# Patient Record
Sex: Female | Born: 1965 | Race: White | Hispanic: No | Smoking: Former smoker
Health system: Southern US, Community
[De-identification: ages and names within clinical notes are randomized; demographics above are authoritative.]

## PROBLEM LIST (undated history)

## (undated) DIAGNOSIS — G629 Polyneuropathy, unspecified: Secondary | ICD-10-CM

## (undated) DIAGNOSIS — I10 Essential (primary) hypertension: Secondary | ICD-10-CM

## (undated) DIAGNOSIS — M898X9 Other specified disorders of bone, unspecified site: Secondary | ICD-10-CM

## (undated) DIAGNOSIS — J449 Chronic obstructive pulmonary disease, unspecified: Secondary | ICD-10-CM

## (undated) DIAGNOSIS — M1990 Unspecified osteoarthritis, unspecified site: Secondary | ICD-10-CM

## (undated) DIAGNOSIS — Z9981 Dependence on supplemental oxygen: Secondary | ICD-10-CM

## (undated) DIAGNOSIS — E119 Type 2 diabetes mellitus without complications: Secondary | ICD-10-CM

## (undated) DIAGNOSIS — M341 CR(E)ST syndrome: Secondary | ICD-10-CM

## (undated) DIAGNOSIS — C801 Malignant (primary) neoplasm, unspecified: Secondary | ICD-10-CM

## (undated) HISTORY — PX: ABDOMINAL HYSTERECTOMY: SHX81

## (undated) HISTORY — PX: KNEE ARTHROSCOPY WITH PATELLA RECONSTRUCTION: SHX5654

## (undated) HISTORY — DX: Chronic obstructive pulmonary disease, unspecified: J44.9

## (undated) HISTORY — DX: Type 2 diabetes mellitus without complications: E11.9

## (undated) HISTORY — DX: Unspecified osteoarthritis, unspecified site: M19.90

## (undated) HISTORY — DX: Malignant (primary) neoplasm, unspecified: C80.1

## (undated) HISTORY — DX: Essential (primary) hypertension: I10

## (undated) HISTORY — PX: TONSILLECTOMY: SUR1361

---

## 2014-07-03 HISTORY — PX: PARTIAL HYSTERECTOMY: SHX80

## 2015-03-09 ENCOUNTER — Encounter: Payer: Self-pay | Admitting: *Deleted

## 2015-03-10 ENCOUNTER — Encounter: Payer: Self-pay | Admitting: Obstetrics & Gynecology

## 2015-03-21 ENCOUNTER — Encounter: Payer: Self-pay | Admitting: Obstetrics & Gynecology

## 2015-03-23 ENCOUNTER — Encounter: Payer: Self-pay | Admitting: Obstetrics & Gynecology

## 2015-03-24 ENCOUNTER — Encounter: Payer: Self-pay | Admitting: Obstetrics & Gynecology

## 2015-03-27 ENCOUNTER — Encounter: Payer: Self-pay | Admitting: Obstetrics & Gynecology

## 2015-04-11 ENCOUNTER — Ambulatory Visit (INDEPENDENT_AMBULATORY_CARE_PROVIDER_SITE_OTHER): Payer: Medicaid Other | Admitting: Obstetrics & Gynecology

## 2015-04-11 ENCOUNTER — Encounter: Payer: Self-pay | Admitting: Obstetrics & Gynecology

## 2015-04-11 VITALS — BP 120/70 | HR 72 | Ht 66.2 in | Wt 232.0 lb

## 2015-04-11 DIAGNOSIS — A499 Bacterial infection, unspecified: Secondary | ICD-10-CM

## 2015-04-11 DIAGNOSIS — N816 Rectocele: Secondary | ICD-10-CM

## 2015-04-11 DIAGNOSIS — B9689 Other specified bacterial agents as the cause of diseases classified elsewhere: Secondary | ICD-10-CM

## 2015-04-11 DIAGNOSIS — N760 Acute vaginitis: Secondary | ICD-10-CM

## 2015-04-11 MED ORDER — METRONIDAZOLE 500 MG PO TABS: 500.0000 mg | ORAL_TABLET | Freq: Two times a day (BID) | ORAL | Status: DC

## 2015-04-11 NOTE — Progress Notes (Signed)
Patient ID: Jennifer Odonnell, female   DOB: 03/10/1966, 49 y.o.   MRN: [id number] Chief Complaint  Patient presents with  . Referral    c/c feel like inside falling out, when use bathroom/ has vaginal odor.    Blood pressure 120/70, pulse 72, height 5' 6.2" (1.681 m), weight 232 lb (105.235 kg).  Subjective Feels like something is "coming" out since a couple of months after her hysterectomy Also with bad odor not much dicharge  Objective Vulva:  normal appearing vulva with no masses, tenderness or lesions Vagina:  normal mucosa, no discharge Cervix:  absent Uterus:  uterus absent Adnexa: ovaries:present,  normal adnexa in size, nontender and no masses   Pertinent ROS No burning with urination, frequency or urgency No nausea, vomiting or diarrhea Nor fever chills or other constitutional symptoms   Labs or studies   Impression New Diagnosis: Rectocoele, Grade III BV(by symptoms, no yeast on exam)  Established relevant diagnosis(es): DM COPD HTN  Plan/Recommendations Metronidazole 500 BID x 7 days No straining  Follow up 3 months or prn   All questions were answered.

## 2015-05-02 ENCOUNTER — Ambulatory Visit (INDEPENDENT_AMBULATORY_CARE_PROVIDER_SITE_OTHER): Payer: Medicaid Other | Admitting: Obstetrics & Gynecology

## 2015-05-02 ENCOUNTER — Encounter: Payer: Self-pay | Admitting: Obstetrics & Gynecology

## 2015-05-02 VITALS — BP 110/70 | HR 76 | Wt 233.0 lb

## 2015-05-02 DIAGNOSIS — N993 Prolapse of vaginal vault after hysterectomy: Secondary | ICD-10-CM | POA: Diagnosis not present

## 2015-05-02 DIAGNOSIS — E860 Dehydration: Secondary | ICD-10-CM | POA: Diagnosis not present

## 2015-05-02 DIAGNOSIS — IMO0002 Reserved for concepts with insufficient information to code with codable children: Secondary | ICD-10-CM

## 2015-05-02 DIAGNOSIS — N816 Rectocele: Secondary | ICD-10-CM | POA: Diagnosis not present

## 2015-05-02 DIAGNOSIS — N8110 Cystocele, unspecified: Secondary | ICD-10-CM | POA: Diagnosis not present

## 2015-05-02 DIAGNOSIS — R8290 Unspecified abnormal findings in urine: Secondary | ICD-10-CM | POA: Diagnosis not present

## 2015-05-02 LAB — POCT URINALYSIS DIPSTICK
Blood, UA: NEGATIVE
Glucose, UA: NEGATIVE
Ketones, UA: NEGATIVE
LEUKOCYTES UA: NEGATIVE
NITRITE UA: NEGATIVE
PROTEIN UA: NEGATIVE

## 2015-05-02 NOTE — Progress Notes (Signed)
Patient ID: Jennifer Odonnell, female   DOB: [birth year], 49 y.o.   MRN: [id number] Chief Complaint  Patient presents with  . gyn visit    something protruding out/painful.    Blood pressure 110/70, pulse 76, weight 233 lb (105.688 kg).  49 y.o. G1P0 No LMP recorded. Patient has had a hysterectomy. The current method of family planning is status post hysterectomy.  Subjective Pt comes in complaining of a strong vaginal odor She was seen 3 weeks ago and had BV but took her metronidazole and on exam today that is cleared  Upon questioning it is only when she urinates and is not constant. UA is negative but does have a strong odor and a deep color consistent with dehydration, pt drinks caffeine beverages all day long she states, not water  2nd issue, on going pelvic pressure and pain from pelvic organ prolapse It is noted from previous exam she has anterior, mid, and posterior compartment prolapse s/p hysterectomy some years ago The posterior compartment is the worst, Grade III   Objective Vulva:  normal appearing vulva with no masses, tenderness or lesions Vagina:  Grade II anterior POP, Grade III posterior POP, Grade II vaginal apex prolapse Cervix:  absent Uterus:  uterus absent Adnexa: ovaries:present,  normal adnexa in size, nontender and no masses    Pertinent ROS No burning with urination, frequency or urgency No nausea, vomiting or diarrhea Nor fever chills or other constitutional symptoms   Labs or studies UA today is negative    Impression Diagnoses this Encounter::   ICD-9-CM ICD-10-CM   1. Dehydration 276.51 E86.0   2. Bad odor of urine 791.9 R82.90 POCT urinalysis dipstick  3. Rectocele 618.04 N81.6   4. Cystocele 618.01 N81.10   5. 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 POCT  urinalysis dipstick    I have instructed patient to drink water until her urine clears and see if she still has the strong, I think ammonia, odor  Additionally I am referring her to the [facility] pelvic reconstruction clinic for evaluation, she may need laparoscopic sacrocolpopexy and address anterior/posterior POP additionally That internal EPIC referral is made today  Follow up Return if symptoms worsen or fail to improve, for Follow up with [facility] pelvic reconstruction group in [place].   All questions were answered

## 2015-05-03 ENCOUNTER — Telehealth: Payer: Self-pay | Admitting: *Deleted

## 2015-05-03 NOTE — Telephone Encounter (Signed)
Pt informed referral for the [facility]construction [facility] completed. Pt will be contacted by their office for appt date and time. Pt verbalized understanding.

## 2015-05-30 DIAGNOSIS — N3946 Mixed incontinence: Secondary | ICD-10-CM | POA: Insufficient documentation

## 2015-07-12 ENCOUNTER — Ambulatory Visit: Payer: Self-pay | Admitting: Obstetrics & Gynecology

## 2015-07-13 ENCOUNTER — Ambulatory Visit: Payer: Self-pay | Admitting: Obstetrics & Gynecology

## 2018-09-02 HISTORY — PX: BLADDER EXTROPHY RECONSTRUCTION PELVIC SAGITTAL OSTEOTOMY: SHX1236

## 2020-09-07 ENCOUNTER — Encounter (HOSPITAL_COMMUNITY): Payer: Self-pay | Admitting: Emergency Medicine

## 2020-09-07 ENCOUNTER — Other Ambulatory Visit: Payer: Self-pay

## 2020-09-07 ENCOUNTER — Inpatient Hospital Stay (HOSPITAL_COMMUNITY)
Admission: EM | Admit: 2020-09-07 | Discharge: 2020-09-11 | DRG: 193 | Disposition: A | Discharge status: Home / Self Care | Payer: Medicaid - Out of State | Attending: Family Medicine | Admitting: Family Medicine

## 2020-09-07 ENCOUNTER — Emergency Department (HOSPITAL_COMMUNITY): Payer: Medicaid - Out of State

## 2020-09-07 DIAGNOSIS — J441 Chronic obstructive pulmonary disease with (acute) exacerbation: Secondary | ICD-10-CM | POA: Diagnosis present

## 2020-09-07 DIAGNOSIS — E119 Type 2 diabetes mellitus without complications: Secondary | ICD-10-CM

## 2020-09-07 DIAGNOSIS — J9601 Acute respiratory failure with hypoxia: Secondary | ICD-10-CM | POA: Diagnosis present

## 2020-09-07 DIAGNOSIS — Z90710 Acquired absence of both cervix and uterus: Secondary | ICD-10-CM | POA: Diagnosis not present

## 2020-09-07 DIAGNOSIS — Z8541 Personal history of malignant neoplasm of cervix uteri: Secondary | ICD-10-CM

## 2020-09-07 DIAGNOSIS — J189 Pneumonia, unspecified organism: Secondary | ICD-10-CM | POA: Diagnosis present

## 2020-09-07 DIAGNOSIS — M341 CR(E)ST syndrome: Secondary | ICD-10-CM | POA: Diagnosis present

## 2020-09-07 DIAGNOSIS — Z794 Long term (current) use of insulin: Secondary | ICD-10-CM

## 2020-09-07 DIAGNOSIS — Z79899 Other long term (current) drug therapy: Secondary | ICD-10-CM

## 2020-09-07 DIAGNOSIS — J188 Other pneumonia, unspecified organism: Secondary | ICD-10-CM | POA: Diagnosis present

## 2020-09-07 DIAGNOSIS — Z7952 Long term (current) use of systemic steroids: Secondary | ICD-10-CM

## 2020-09-07 DIAGNOSIS — Z881 Allergy status to other antibiotic agents status: Secondary | ICD-10-CM | POA: Diagnosis not present

## 2020-09-07 DIAGNOSIS — J440 Chronic obstructive pulmonary disease with acute lower respiratory infection: Secondary | ICD-10-CM | POA: Diagnosis present

## 2020-09-07 DIAGNOSIS — Z6841 Body Mass Index (BMI) 40.0 and over, adult: Secondary | ICD-10-CM

## 2020-09-07 DIAGNOSIS — Z7951 Long term (current) use of inhaled steroids: Secondary | ICD-10-CM

## 2020-09-07 DIAGNOSIS — E1165 Type 2 diabetes mellitus with hyperglycemia: Secondary | ICD-10-CM | POA: Diagnosis present

## 2020-09-07 DIAGNOSIS — D849 Immunodeficiency, unspecified: Secondary | ICD-10-CM | POA: Diagnosis present

## 2020-09-07 DIAGNOSIS — Z87891 Personal history of nicotine dependence: Secondary | ICD-10-CM | POA: Diagnosis not present

## 2020-09-07 DIAGNOSIS — Z825 Family history of asthma and other chronic lower respiratory diseases: Secondary | ICD-10-CM | POA: Diagnosis not present

## 2020-09-07 DIAGNOSIS — Z20822 Contact with and (suspected) exposure to covid-19: Secondary | ICD-10-CM | POA: Diagnosis present

## 2020-09-07 DIAGNOSIS — I10 Essential (primary) hypertension: Secondary | ICD-10-CM | POA: Diagnosis present

## 2020-09-07 DIAGNOSIS — B9781 Human metapneumovirus as the cause of diseases classified elsewhere: Secondary | ICD-10-CM | POA: Diagnosis present

## 2020-09-07 DIAGNOSIS — Z7982 Long term (current) use of aspirin: Secondary | ICD-10-CM

## 2020-09-07 DIAGNOSIS — Z888 Allergy status to other drugs, medicaments and biological substances status: Secondary | ICD-10-CM

## 2020-09-07 LAB — BASIC METABOLIC PANEL
Anion gap: 11 (ref 5–15)
BUN: 15 mg/dL (ref 6–20)
CO2: 32 mmol/L (ref 22–32)
Calcium: 8.9 mg/dL (ref 8.9–10.3)
Chloride: 93 mmol/L — ABNORMAL LOW (ref 98–111)
Creatinine, Ser: 0.91 mg/dL (ref 0.44–1.00)
GFR, Estimated: >60 mL/min (ref >60)
Glucose, Bld: 146 mg/dL — ABNORMAL HIGH (ref 70–99)
Potassium: 4 mmol/L (ref 3.5–5.1)
Sodium: 136 mmol/L (ref 135–145)

## 2020-09-07 LAB — CBC WITH DIFFERENTIAL/PLATELET
Abs Immature Granulocytes: 0.05 10*3/uL (ref 0.00–0.07)
Basophils Absolute: 0 10*3/uL (ref 0.0–0.1)
Basophils Relative: 0 %
Eosinophils Absolute: 0.1 10*3/uL (ref 0.0–0.5)
Eosinophils Relative: 1 %
HCT: 41.7 % (ref 36.0–46.0)
Hemoglobin: 13 g/dL (ref 12.0–15.0)
Immature Granulocytes: 1 %
Lymphocytes Relative: 10 %
Lymphs Abs: 0.9 10*3/uL (ref 0.7–4.0)
MCH: 32 pg (ref 26.0–34.0)
MCHC: 31.2 g/dL (ref 30.0–36.0)
MCV: 102.7 fL — ABNORMAL HIGH (ref 80.0–100.0)
Monocytes Absolute: 0.4 10*3/uL (ref 0.1–1.0)
Monocytes Relative: 4 %
Neutro Abs: 7.6 10*3/uL (ref 1.7–7.7)
Neutrophils Relative %: 84 %
Platelets: 183 10*3/uL (ref 150–400)
RBC: 4.06 MIL/uL (ref 3.87–5.11)
RDW: 13.6 % (ref 11.5–15.5)
WBC: 8.9 10*3/uL (ref 4.0–10.5)
nRBC: 0.4 % — ABNORMAL HIGH (ref 0.0–0.2)

## 2020-09-07 LAB — RESP PANEL BY RT-PCR (FLU A&B, COVID) ARPGX2
Influenza A by PCR: NEGATIVE
Influenza B by PCR: NEGATIVE
SARS Coronavirus 2 by RT PCR: NEGATIVE

## 2020-09-07 LAB — CBG MONITORING, ED: Glucose-Capillary: 178 mg/dL — ABNORMAL HIGH (ref 70–99)

## 2020-09-07 LAB — PROCALCITONIN: Procalcitonin: <0.1 ng/mL

## 2020-09-07 LAB — POC SARS CORONAVIRUS 2 AG -  ED: SARS Coronavirus 2 Ag: NEGATIVE

## 2020-09-07 MED ORDER — SIMVASTATIN 20 MG PO TABS
40.0000 mg | ORAL_TABLET | Freq: Every evening | ORAL | Status: DC
Start: 2020-09-08 — End: 2020-09-11
  Administered 2020-09-08 – 2020-09-10 (×3): 40 mg via ORAL
  Filled 2020-09-07 (×3): qty 2

## 2020-09-07 MED ORDER — MAGNESIUM SULFATE 2 GM/50ML IV SOLN
2.0000 g | Freq: Once | INTRAVENOUS | Status: AC
  Administered 2020-09-08: 2 g via INTRAVENOUS
  Filled 2020-09-07: qty 50

## 2020-09-07 MED ORDER — SODIUM CHLORIDE 0.9 % IV SOLN
500.0000 mg | INTRAVENOUS | Status: DC
  Administered 2020-09-08 – 2020-09-10 (×4): 500 mg via INTRAVENOUS
  Filled 2020-09-07 (×4): qty 500

## 2020-09-07 MED ORDER — ADULT MULTIVITAMIN W/MINERALS CH
1.0000 | ORAL_TABLET | Freq: Every day | ORAL | Status: DC
  Administered 2020-09-08 – 2020-09-11 (×4): 1 via ORAL
  Filled 2020-09-07 (×4): qty 1

## 2020-09-07 MED ORDER — NYSTATIN 100000 UNIT/GM EX OINT
1.0000 "application " | TOPICAL_OINTMENT | Freq: Two times a day (BID) | CUTANEOUS | Status: DC
  Administered 2020-09-08 – 2020-09-10 (×3): 1 via TOPICAL
  Filled 2020-09-07 (×2): qty 15

## 2020-09-07 MED ORDER — SODIUM CHLORIDE 0.9 % IV SOLN: 2.0000 g | INTRAVENOUS | Status: DC

## 2020-09-07 MED ORDER — DICLOFENAC SODIUM 1 % EX GEL
2.0000 g | Freq: Four times a day (QID) | CUTANEOUS | Status: DC
  Filled 2020-09-07 (×2): qty 100

## 2020-09-07 MED ORDER — SODIUM CHLORIDE 0.9 % IV SOLN: 500.0000 mg | Freq: Once | INTRAVENOUS | Status: DC

## 2020-09-07 MED ORDER — UMECLIDINIUM BROMIDE 62.5 MCG/INH IN AEPB: 1.0000 | INHALATION_SPRAY | Freq: Every day | RESPIRATORY_TRACT | Status: DC

## 2020-09-07 MED ORDER — ALBUTEROL SULFATE HFA 108 (90 BASE) MCG/ACT IN AERS
6.0000 | INHALATION_SPRAY | Freq: Once | RESPIRATORY_TRACT | Status: AC
  Administered 2020-09-07: 6 via RESPIRATORY_TRACT
  Filled 2020-09-07: qty 6.7

## 2020-09-07 MED ORDER — GABAPENTIN 300 MG PO CAPS
600.0000 mg | ORAL_CAPSULE | Freq: Three times a day (TID) | ORAL | Status: DC
  Administered 2020-09-08 – 2020-09-11 (×12): 600 mg via ORAL
  Filled 2020-09-07 (×12): qty 2

## 2020-09-07 MED ORDER — LOSARTAN POTASSIUM-HCTZ 100-12.5 MG PO TABS: 1.0000 | ORAL_TABLET | Freq: Every day | ORAL | Status: DC

## 2020-09-07 MED ORDER — ONE-A-DAY WOMENS FORMULA PO TABS: 1.0000 | ORAL_TABLET | Freq: Every day | ORAL | Status: DC

## 2020-09-07 MED ORDER — IPRATROPIUM-ALBUTEROL 0.5-2.5 (3) MG/3ML IN SOLN
3.0000 mL | Freq: Three times a day (TID) | RESPIRATORY_TRACT | Status: DC
  Administered 2020-09-08: 3 mL via RESPIRATORY_TRACT
  Filled 2020-09-07: qty 3

## 2020-09-07 MED ORDER — LOSARTAN POTASSIUM 50 MG PO TABS
100.0000 mg | ORAL_TABLET | Freq: Every day | ORAL | Status: DC
  Administered 2020-09-08 – 2020-09-11 (×4): 100 mg via ORAL
  Filled 2020-09-07 (×5): qty 2

## 2020-09-07 MED ORDER — PREDNISONE 20 MG PO TABS: 40.0000 mg | ORAL_TABLET | Freq: Every day | ORAL | Status: DC

## 2020-09-07 MED ORDER — AEROCHAMBER PLUS FLO-VU MEDIUM MISC
1.0000 | Freq: Once | Status: AC
  Administered 2020-09-07: 1
  Filled 2020-09-07: qty 1

## 2020-09-07 MED ORDER — PANTOPRAZOLE SODIUM 40 MG PO TBEC
80.0000 mg | DELAYED_RELEASE_TABLET | Freq: Every day | ORAL | Status: DC
  Administered 2020-09-08 – 2020-09-11 (×4): 80 mg via ORAL
  Filled 2020-09-07 (×4): qty 2

## 2020-09-07 MED ORDER — CARISOPRODOL 350 MG PO TABS
350.0000 mg | ORAL_TABLET | Freq: Three times a day (TID) | ORAL | Status: DC
  Administered 2020-09-08 – 2020-09-11 (×12): 350 mg via ORAL
  Filled 2020-09-07 (×12): qty 1

## 2020-09-07 MED ORDER — ATENOLOL 25 MG PO TABS
25.0000 mg | ORAL_TABLET | Freq: Every day | ORAL | Status: DC
  Administered 2020-09-08 – 2020-09-11 (×4): 25 mg via ORAL
  Filled 2020-09-07 (×4): qty 1

## 2020-09-07 MED ORDER — MOMETASONE FURO-FORMOTEROL FUM 200-5 MCG/ACT IN AERO
2.0000 | INHALATION_SPRAY | Freq: Two times a day (BID) | RESPIRATORY_TRACT | Status: DC
  Administered 2020-09-08 – 2020-09-11 (×6): 2 via RESPIRATORY_TRACT
  Filled 2020-09-07 (×2): qty 8.8

## 2020-09-07 MED ORDER — INSULIN GLARGINE 100 UNIT/ML ~~LOC~~ SOLN
35.0000 [IU] | Freq: Two times a day (BID) | SUBCUTANEOUS | Status: DC
  Administered 2020-09-08 – 2020-09-11 (×7): 35 [IU] via SUBCUTANEOUS
  Filled 2020-09-07 (×11): qty 0.35

## 2020-09-07 MED ORDER — INSULIN ASPART 100 UNIT/ML ~~LOC~~ SOLN
0.0000 [IU] | Freq: Three times a day (TID) | SUBCUTANEOUS | Status: DC
  Administered 2020-09-08: 20 [IU] via SUBCUTANEOUS
  Administered 2020-09-08: 7 [IU] via SUBCUTANEOUS
  Administered 2020-09-09: 15 [IU] via SUBCUTANEOUS
  Administered 2020-09-09 – 2020-09-10 (×3): 11 [IU] via SUBCUTANEOUS
  Administered 2020-09-10: 7 [IU] via SUBCUTANEOUS
  Administered 2020-09-10: 11 [IU] via SUBCUTANEOUS
  Administered 2020-09-11: 3 [IU] via SUBCUTANEOUS
  Administered 2020-09-11: 7 [IU] via SUBCUTANEOUS

## 2020-09-07 MED ORDER — FOLIC ACID 1 MG PO TABS
1.0000 mg | ORAL_TABLET | Freq: Every day | ORAL | Status: DC
  Administered 2020-09-08 – 2020-09-11 (×4): 1 mg via ORAL
  Filled 2020-09-07 (×4): qty 1

## 2020-09-07 MED ORDER — INDOMETHACIN 25 MG PO CAPS
50.0000 mg | ORAL_CAPSULE | Freq: Two times a day (BID) | ORAL | Status: DC
  Administered 2020-09-08: 50 mg via ORAL
  Filled 2020-09-07: qty 2

## 2020-09-07 MED ORDER — ENOXAPARIN SODIUM 40 MG/0.4ML ~~LOC~~ SOLN
40.0000 mg | SUBCUTANEOUS | Status: DC
  Administered 2020-09-08: 40 mg via SUBCUTANEOUS
  Filled 2020-09-07: qty 0.4

## 2020-09-07 MED ORDER — SODIUM CHLORIDE 0.9 % IV SOLN
2.0000 g | INTRAVENOUS | Status: DC
  Administered 2020-09-08 – 2020-09-10 (×4): 2 g via INTRAVENOUS
  Filled 2020-09-07 (×4): qty 20

## 2020-09-07 MED ORDER — ALBUTEROL SULFATE (2.5 MG/3ML) 0.083% IN NEBU
2.5000 mg | INHALATION_SOLUTION | RESPIRATORY_TRACT | Status: DC | PRN
  Administered 2020-09-08 – 2020-09-11 (×4): 2.5 mg via RESPIRATORY_TRACT
  Filled 2020-09-07 (×4): qty 3

## 2020-09-07 MED ORDER — HYDROCHLOROTHIAZIDE 12.5 MG PO CAPS
12.5000 mg | ORAL_CAPSULE | Freq: Every day | ORAL | Status: DC
  Administered 2020-09-08 – 2020-09-11 (×4): 12.5 mg via ORAL
  Filled 2020-09-07 (×4): qty 1

## 2020-09-07 MED ORDER — ENOXAPARIN SODIUM 40 MG/0.4ML ~~LOC~~ SOLN: 40.0000 mg | SUBCUTANEOUS | Status: DC

## 2020-09-07 MED ORDER — ALBUTEROL (5 MG/ML) CONTINUOUS INHALATION SOLN
10.0000 mg/h | INHALATION_SOLUTION | Freq: Once | RESPIRATORY_TRACT | Status: AC
  Administered 2020-09-07: 10 mg/h via RESPIRATORY_TRACT
  Filled 2020-09-07: qty 20

## 2020-09-07 MED ORDER — OXYCODONE HCL 5 MG PO TABS
10.0000 mg | ORAL_TABLET | Freq: Four times a day (QID) | ORAL | Status: DC | PRN
  Administered 2020-09-08 – 2020-09-11 (×11): 10 mg via ORAL
  Filled 2020-09-07 (×11): qty 2

## 2020-09-07 MED ORDER — IBUPROFEN 800 MG PO TABS
800.0000 mg | ORAL_TABLET | Freq: Two times a day (BID) | ORAL | Status: DC
  Administered 2020-09-08: 800 mg via ORAL
  Filled 2020-09-07: qty 1

## 2020-09-07 MED ORDER — HYDROXYCHLOROQUINE SULFATE 200 MG PO TABS: 400.0000 mg | ORAL_TABLET | Freq: Every day | ORAL | Status: DC

## 2020-09-07 MED ORDER — METHYLPREDNISOLONE SODIUM SUCC 125 MG IJ SOLR
125.0000 mg | Freq: Once | INTRAMUSCULAR | Status: AC
  Administered 2020-09-07: 125 mg via INTRAVENOUS
  Filled 2020-09-07: qty 2

## 2020-09-07 MED ORDER — FENOFIBRATE 54 MG PO TABS
54.0000 mg | ORAL_TABLET | Freq: Every day | ORAL | Status: DC
  Administered 2020-09-08 – 2020-09-11 (×4): 54 mg via ORAL
  Filled 2020-09-07 (×6): qty 1

## 2020-09-07 MED ORDER — ASPIRIN EC 81 MG PO TBEC
81.0000 mg | DELAYED_RELEASE_TABLET | Freq: Every day | ORAL | Status: DC
  Administered 2020-09-08 – 2020-09-11 (×4): 81 mg via ORAL
  Filled 2020-09-07 (×4): qty 1

## 2020-09-07 MED ORDER — DOCUSATE SODIUM 100 MG PO CAPS
100.0000 mg | ORAL_CAPSULE | Freq: Two times a day (BID) | ORAL | Status: DC
  Administered 2020-09-08 – 2020-09-11 (×8): 100 mg via ORAL
  Filled 2020-09-07 (×9): qty 1

## 2020-09-07 MED ORDER — FERROUS SULFATE 325 (65 FE) MG PO TABS
325.0000 mg | ORAL_TABLET | Freq: Every day | ORAL | Status: DC
Start: 2020-09-08 — End: 2020-09-11
  Administered 2020-09-08 – 2020-09-10 (×3): 325 mg via ORAL
  Filled 2020-09-07 (×5): qty 1

## 2020-09-07 MED ORDER — SODIUM CHLORIDE 0.9 % IV SOLN: 1.0000 g | Freq: Once | INTRAVENOUS | Status: DC

## 2020-09-07 MED ORDER — PROMETHAZINE HCL 12.5 MG PO TABS
25.0000 mg | ORAL_TABLET | Freq: Two times a day (BID) | ORAL | Status: DC | PRN
  Administered 2020-09-08: 25 mg via ORAL
  Filled 2020-09-07: qty 2

## 2020-09-07 MED ORDER — COLCHICINE 0.6 MG PO TABS
0.6000 mg | ORAL_TABLET | Freq: Every day | ORAL | Status: DC
  Administered 2020-09-08 – 2020-09-11 (×4): 0.6 mg via ORAL
  Filled 2020-09-07 (×4): qty 1

## 2020-09-07 MED ORDER — ESTROGENS CONJUGATED 0.625 MG PO TABS
0.6250 mg | ORAL_TABLET | Freq: Every day | ORAL | Status: DC
  Administered 2020-09-08 – 2020-09-11 (×4): 0.625 mg via ORAL
  Filled 2020-09-07 (×10): qty 1

## 2020-09-07 NOTE — ED Notes (Addendum)
Waiting to start meds once pt has IV access. Difficult stick. Ultrasound needed

## 2020-09-07 NOTE — ED Triage Notes (Signed)
Pt to the ED with hypoxia and covid symptoms since Monday.  Symptoms include coughing, congestion,and HA.   Pt's oxygen saturation was at 67 on RA. Pt's nailbeds and lips were cyanotic.  Pt was placed on 2L [place] and sats rose to 88. Pt on 3L [place] and sats are 92. Pt has a history of COPD.

## 2020-09-07 NOTE — ED Provider Notes (Signed)
[facility] EMERGENCY DEPARTMENT Provider Note   CSN: [id number] Arrival date & time: 09/07/20  1726     History Chief Complaint  Patient presents with  . Hypoxia    Jennifer Odonnell is a 55 y.o. female.  HPI   55 y/o female with a h/o arthritis, CA, COPD, DM, HTN, RA, who presents to the ED today for eval of shortness of breath.  4 days ago she started having nasal congestion, chest congestion, cough and headache.  Also with subjective fevers.  No chest pain.  No vomiting or diarrhea.  Has not had her Covid vaccine and denies any known exposures.  States she does not use oxygen at home.  Past Medical History:  Diagnosis Date  . Arthritis   . Cancer ([place])    cervical cancer at age 71  . COPD (chronic obstructive pulmonary disease) ([place])   . Diabetes mellitus without complication ([place])   . Hypertension     Patient Active Problem List   Diagnosis Date Noted  . COPD with acute exacerbation ([place]) 09/07/2020  . Multifocal pneumonia 09/07/2020  . Acute respiratory failure with hypoxia ([place]) 09/07/2020  . Mixed incontinence urge and stress 05/30/2015    Past Surgical History:  Procedure Laterality Date  . ABDOMINAL HYSTERECTOMY    . BLADDER EXTROPHY RECONSTRUCTION PELVIC SAGITTAL OSTEOTOMY  2020  . KNEE ARTHROSCOPY WITH PATELLA RECONSTRUCTION Bilateral   . PARTIAL HYSTERECTOMY  07/2014  . TONSILLECTOMY       OB History    Gravida  1   Para      Term      Preterm      AB      Living        SAB      IAB      Ectopic      Multiple      Live Births              Family History  Problem Relation Age of Onset  . Asthma Mother   . Emphysema Mother   . Arthritis Mother   . Hyperlipidemia Mother   . Diabetes Mother   . Hypertension Mother   . Heart disease Father   . Heart attack Father   . Cancer Father        throat  . Hyperlipidemia Father   . Alcohol abuse Father   . Arthritis Father   . Asthma Sister   . Emphysema Sister   . Seizures  Sister   . Hypertension Sister   . Arthritis Brother   . Hypertension Brother   . Thyroid disease Daughter     Social History   Tobacco Use  . Smoking status: Former Smoker    Packs/day: 1.00    Years: 30.00    Pack years: 30.00    Types: Cigarettes  . Smokeless tobacco: Never Used  . Tobacco comment: quit smoking 7 years ago  Vaping Use  . Vaping Use: Every day  . Substances: Nicotine, Flavoring  Substance Use Topics  . Alcohol use: Not Currently  . Drug use: Not Currently    Home Medications Prior to Admission medications   Medication Sig Start Date End Date Taking? Authorizing Provider  albuterol (PROVENTIL HFA;VENTOLIN HFA) 108 (90 BASE) MCG/ACT inhaler Inhale 2 puffs into the lungs every 6 (six) hours as needed for wheezing or shortness of breath.   Yes [provider]  albuterol (PROVENTIL) (2.5 MG/3ML) 0.083% nebulizer solution Take 2.5 mg by nebulization every 6 (  six) hours as needed for wheezing or shortness of breath.   Yes [provider]  ascorbic acid (VITAMIN C) 500 MG tablet Take 500 mg by mouth daily.   Yes [provider]  aspirin 81 MG EC tablet Take 81 mg by mouth daily.   Yes [provider]  atenolol (TENORMIN) 25 MG tablet Take 25 mg by mouth daily. 08/14/20  Yes [provider]  budesonide (PULMICORT) 0.5 MG/2ML nebulizer solution Take 0.5 mg by nebulization 2 (two) times daily. 08/14/20  Yes [provider]  carisoprodol (SOMA) 350 MG tablet Take 350 mg by mouth 3 (three) times daily. 08/04/20  Yes [provider]  colchicine 0.6 MG tablet Take 0.6 mg by mouth daily. 09/04/20  Yes [provider]  diclofenac Sodium (VOLTAREN) 1 % GEL Apply 1 g topically 4 (four) times daily. 08/14/20  Yes [provider]  docusate sodium (COLACE) 100 MG capsule Take 100 mg by mouth 2 (two) times daily.   Yes [provider]  fenofibrate (TRICOR) 48 MG tablet Take 48 mg by mouth daily.  08/14/20  Yes [provider]  FEROSUL 325 (65 Fe) MG tablet Take 325 mg by mouth daily. 08/15/20  Yes [provider]  folic acid (FOLVITE) 1 MG tablet Take 1 mg by mouth daily. 08/14/20  Yes [provider]  furosemide (LASIX) 20 MG tablet Take 20 mg by mouth daily. 08/14/20  Yes [provider]  gabapentin (NEURONTIN) 600 MG tablet Take 600 mg by mouth 3 (three) times daily. 09/01/20  Yes [provider]  glipiZIDE (GLUCOTROL XL) 2.5 MG 24 hr tablet Take 2.5 mg by mouth every morning. 08/14/20  Yes [provider]  hydroxychloroquine (PLAQUENIL) 200 MG tablet Take 400 mg by mouth daily. 08/14/20  Yes [provider]  ibuprofen (ADVIL,MOTRIN) 800 MG tablet Take 800 mg by mouth 2 (two) times daily.   Yes [provider]  indomethacin (INDOCIN) 50 MG capsule Take 50 mg by mouth 2 (two) times daily. 08/29/20  Yes [provider]  ipratropium-albuterol (DUONEB) 0.5-2.5 (3) MG/3ML SOLN Take 3 mLs by nebulization 3 (three) times daily. 08/14/20  Yes [provider]  LANTUS SOLOSTAR 100 UNIT/ML Solostar Pen Inject 44 Units into the skin 2 (two) times daily. 09/04/20  Yes [provider]  losartan-hydrochlorothiazide (HYZAAR) 100-12.5 MG tablet Take 1 tablet by mouth daily. 08/14/20  Yes [provider]  metFORMIN (GLUCOPHAGE) 1000 MG tablet Take 1,000 mg by mouth 2 (two) times daily with a meal.   Yes [provider]  Multiple Vitamins-Calcium (ONE-A-DAY WOMENS FORMULA) TABS Take 1 tablet by mouth daily.   Yes [provider]  nystatin ointment (MYCOSTATIN) Apply 1 application topically 2 (two) times daily.   Yes [provider]  omeprazole (PRILOSEC) 40 MG capsule Take 40 mg by mouth daily. 08/15/20  Yes [provider]  Oxycodone HCl 10 MG TABS Take 10 mg by mouth 4 (four) times daily. 08/25/20  Yes [provider]  predniSONE (DELTASONE) 10 MG tablet  Take 10 mg by mouth daily. 08/14/20  Yes [provider]  PREMARIN 0.625 MG tablet Take 0.625 mg by mouth daily. 08/14/20  Yes [provider]  promethazine (PHENERGAN) 25 MG tablet Take 25 mg by mouth every 12 (twelve) hours.   Yes [provider]  simvastatin (ZOCOR) 40 MG tablet Take 40 mg by mouth every evening. 08/14/20  Yes [provider]  sitaGLIPtin (JANUVIA) 100 MG tablet Take 100  mg by mouth daily.   Yes [provider]  Vitamin D, Ergocalciferol, (DRISDOL) 1.25 MG (50000 UNIT) CAPS capsule Take 50,000 Units by mouth once a week. 08/14/20  Yes [provider]  TRULICITY 0.75 MG/0.5ML SOPN Inject 0.75 mg into the skin once a week. 09/04/20   [provider]    Allergies    Daucus carota, Lisinopril, Tiotropium, Erythromycin, and Spiriva handihaler [tiotropium bromide monohydrate]  Review of Systems   Review of Systems  Constitutional: Positive for fever. Negative for chills.  HENT: Positive for congestion and rhinorrhea. Negative for ear pain and sore throat.   Eyes: Negative for visual disturbance.  Respiratory: Positive for cough and shortness of breath.   Cardiovascular: Negative for chest pain.  Gastrointestinal: Negative for abdominal pain, constipation, diarrhea, nausea and vomiting.  Genitourinary: Negative for dysuria and hematuria.  Musculoskeletal: Positive for myalgias. Negative for arthralgias and back pain.  Skin: Negative for rash.  Neurological: Negative for seizures and syncope.  All other systems reviewed and are negative.   Physical Exam Updated Vital Signs BP (!) 152/99 (BP Location: Right Arm)   Pulse (!) 104   Temp 99.2 F (37.3 C) (Oral)   Resp 18   Ht 5\' 6"  (1.676 m)   Wt (!) 145.2 kg   SpO2 91%   BMI 51.65 kg/m   Physical Exam Vitals and nursing note reviewed.  Constitutional:      General: She is not in acute distress.    Appearance: She is well-developed and well-nourished.   HENT:     Head: Normocephalic and atraumatic.  Eyes:     Conjunctiva/sclera: Conjunctivae normal.  Cardiovascular:     Rate and Rhythm: Normal rate and regular rhythm.     Heart sounds: Normal heart sounds. No murmur heard.   Pulmonary:     Comments: Tachypneic, rales and wheezing noted  Abdominal:     General: Bowel sounds are normal.     Palpations: Abdomen is soft.     Tenderness: There is no abdominal tenderness. There is no guarding or rebound.  Musculoskeletal:        General: No edema.     Cervical back: Neck supple.  Skin:    General: Skin is warm and dry.  Neurological:     Mental Status: She is alert.  Psychiatric:        Mood and Affect: Mood and affect normal.     ED Results / Procedures / Treatments   Labs (all labs ordered are listed, but only abnormal results are displayed) Labs Reviewed  CBC WITH DIFFERENTIAL/PLATELET - Abnormal; Notable for the following components:      Result Value   MCV 102.7 (*)    nRBC 0.4 (*)    All other components within normal limits  BASIC METABOLIC PANEL - Abnormal; Notable for the following components:   Chloride 93 (*)    Glucose, Bld 146 (*)    All other components within normal limits  CBG MONITORING, ED - Abnormal; Notable for the following components:   Glucose-Capillary 178 (*)    All other components within normal limits  RESP PANEL BY RT-PCR (FLU A&B, COVID) ARPGX2  SARS CORONAVIRUS 2 (TAT 6-24 HRS)  PROCALCITONIN  HIV ANTIBODY (ROUTINE TESTING W REFLEX)  POC SARS CORONAVIRUS 2 AG -  ED  POC SARS CORONAVIRUS 2 AG -  ED    EKG EKG Interpretation  Date/Time:  Thursday September 07 2020 17:39:05 EST Ventricular Rate:  83 PR Interval:  156  QRS Duration: 76 QT Interval:  344 QTC Calculation: 404 R Axis:   42 Text Interpretation: Normal sinus rhythm Possible Anterior infarct , age undetermined Abnormal ECG No old tracing to compare Confirmed by Daleen Bo [phone]) on 09/07/2020 9:02:02 PM   Radiology DG  Chest 2 View  Result Date: 09/07/2020 CLINICAL DATA:  Hypoxia and shortness of breath. EXAM: CHEST - 2 VIEW COMPARISON:  None. FINDINGS: Moderate severity multifocal infiltrates are seen throughout both lungs. There is no evidence of a pleural effusion or pneumothorax. The heart size and mediastinal contours are within normal limits. The visualized skeletal structures are unremarkable. IMPRESSION: Moderate severity bilateral multifocal infiltrates. Electronically Signed   By: Virgina Norfolk M.D.   On: 09/07/2020 18:42    Procedures Procedures (including critical care time)  CRITICAL CARE Performed by: Rodney Booze   Total critical care time: 40 minutes  Critical care time was exclusive of separately billable procedures and treating other patients.  Critical care was necessary to treat or prevent imminent or life-threatening deterioration.  Critical care was time spent personally by me on the following activities: development of treatment plan with patient and/or surrogate as well as nursing, discussions with consultants, evaluation of patient's response to treatment, examination of patient, obtaining history from patient or surrogate, ordering and performing treatments and interventions, ordering and review of laboratory studies, ordering and review of radiographic studies, pulse oximetry and re-evaluation of patient's condition.   Medications Ordered in ED Medications  magnesium sulfate IVPB 2 g 50 mL (has no administration in time range)  azithromycin (ZITHROMAX) 500 mg in sodium chloride 0.9 % 250 mL IVPB (has no administration in time range)  predniSONE (DELTASONE) tablet 40 mg (has no administration in time range)  mometasone-formoterol (DULERA) 200-5 MCG/ACT inhaler 2 puff (2 puffs Inhalation Not Given 09/07/20 2239)  albuterol (PROVENTIL) (2.5 MG/3ML) 0.083% nebulizer solution 2.5 mg (has no administration in time range)  cefTRIAXone (ROCEPHIN) 2 g in sodium chloride 0.9 % 100  mL IVPB (has no administration in time range)  enoxaparin (LOVENOX) injection 40 mg (has no administration in time range)  methylPREDNISolone sodium succinate (SOLU-MEDROL) 125 mg/2 mL injection 125 mg (125 mg Intravenous Given 09/07/20 1929)  albuterol (VENTOLIN HFA) 108 (90 Base) MCG/ACT inhaler 6 puff (6 puffs Inhalation Given 09/07/20 1858)  AeroChamber Plus Flo-Vu Medium MISC 1 each (1 each Other Given 09/07/20 1859)  albuterol (PROVENTIL,VENTOLIN) solution continuous neb (10 mg/hr Nebulization Given 09/07/20 2214)    ED Course  I have reviewed the triage vital signs and the nursing notes.  Pertinent labs & imaging results that were available during my care of the patient were reviewed by me and considered in my medical decision making (see chart for details).    MDM Rules/Calculators/A&P                          55 year old female presenting for evaluation of upper respiratory symptoms no shortness of breath.  Reviewed/interpreted labs CBC unremarkable BMP unremarkable Point-of-care Covid is negative Covid PCR negative  CXR reviewed/interpreted - Moderate severity bilateral multifocal infiltrates.  Patient here with acute hypoxic respiratory failure.  History of COPD.  Chest x-ray shows multifocal infiltrates.  Initially concern for Covid but Covid tests are negative so therefore have increased suspicion for multifocal pneumonia with COPD exacerbation.  Patient significantly wheezing on exam.  She has received multiple albuterol treatments here in the emergency department today and has not had any significant improvement.  He is also received Solu-Medrol and magnesium has been ordered.  We will consult hospitalist for admission for further treatment.  10:18 PM CONSULT with Dr. Pecola Leisure who accepts patient for admission    Final Clinical Impression(s) / ED Diagnoses Final diagnoses:  Acute respiratory failure with hypoxia [facility])    Rx / DC Orders ED Discharge Orders    None        Rodney Booze, PA-C 09/07/20 2310    Daleen Bo, MD 09/07/20 2350

## 2020-09-07 NOTE — H&P (Signed)
History and Physical    Jennifer Odonnell [id number] DOB: [birth year] DOA: 09/07/2020  PCP: Kennieth Rad, MD  Patient coming from: Home  I have personally briefly reviewed patient's old medical records in [place]  Chief Complaint: SOB, wheezing, fever  HPI: Jennifer Odonnell is a 55 y.o. female with medical history significant of COPD, DM2, Crest syndrome, HTN, morbid obesity.  Pt unvaccinated to [place].  Pt presents to ED with c/o SOB.  4 days ago had onset of nasal congestion, chest congestion, cough, headache, subjective fevers.  No CP, no vomiting nor diarrhea.  Not on O2 at home.  Is on Plaquinel and steroids at baseline for h/o CREST syndrome.  Had very similar symptoms a few months back that ended up being due to [place]ainfluenza virus [place]).   ED Course: Pt with new 6L O2 requirement.  Tm 101.x  WBC nl, procalcitonin nl.  CXR shows multifocal PNA.  covid POCT is neg.  COVID PCR is also neg,  Influenza PCR neg.  Put on rocephin + azithro and hospitalist asked to admit.   Review of Systems: As per HPI, otherwise all review of systems negative.  Past Medical History:  Diagnosis Date  . Arthritis   . Cancer ([place])    cervical cancer at age 8  . COPD (chronic obstructive pulmonary disease) ([place])   . Diabetes mellitus without complication ([place])   . Hypertension     Past Surgical History:  Procedure Laterality Date  . ABDOMINAL HYSTERECTOMY    . BLADDER EXTROPHY RECONSTRUCTION PELVIC SAGITTAL OSTEOTOMY  2020  . KNEE ARTHROSCOPY WITH PATELLA RECONSTRUCTION Bilateral   . PARTIAL HYSTERECTOMY  07/2014  . TONSILLECTOMY       reports that she has quit smoking. Her smoking use included cigarettes. She has a 30.00 pack-year smoking history. She has never used smokeless tobacco. She reports previous alcohol use. She reports previous drug use.  Allergies  Allergen Reactions  . Daucus [place] Swelling    carrot  . Lisinopril Anaphylaxis   . Tiotropium Swelling  . Erythromycin Diarrhea  . Spiriva Handihaler [Tiotropium Bromide Monohydrate]     Family History  Problem Relation Age of Onset  . Asthma Mother   . Emphysema Mother   . Arthritis Mother   . Hyperlipidemia Mother   . Diabetes Mother   . Hypertension Mother   . Heart disease Father   . Heart attack Father   . Cancer Father        throat  . Hyperlipidemia Father   . Alcohol abuse Father   . Arthritis Father   . Asthma Sister   . Emphysema Sister   . Seizures Sister   . Hypertension Sister   . Arthritis Brother   . Hypertension Brother   . Thyroid disease Daughter      Prior to Admission medications   Medication Sig Start Date End Date Taking? Authorizing Provider  albuterol (PROVENTIL HFA;VENTOLIN HFA) 108 (90 BASE) MCG/ACT inhaler Inhale 2 puffs into the lungs every 6 (six) hours as needed for wheezing or shortness of breath.   Yes [provider]  albuterol (PROVENTIL) (2.5 MG/3ML) 0.083% nebulizer solution Take 2.5 mg by nebulization every 6 (six) hours as needed for wheezing or shortness of breath.   Yes [provider]  ascorbic acid (VITAMIN C) 500 MG tablet Take 500 mg by mouth daily.   Yes [provider]  aspirin 81 MG EC tablet Take 81 mg by mouth daily.   Yes  [provider]  atenolol (TENORMIN) 25 MG tablet Take 25 mg by mouth daily. 08/14/20  Yes [provider]  budesonide (PULMICORT) 0.5 MG/2ML nebulizer solution Take 0.5 mg by nebulization 2 (two) times daily. 08/14/20  Yes [provider]  carisoprodol (SOMA) 350 MG tablet Take 350 mg by mouth 3 (three) times daily. 08/04/20  Yes [provider]  colchicine 0.6 MG tablet Take 0.6 mg by mouth daily. 09/04/20  Yes [provider]  diclofenac Sodium (VOLTAREN) 1 % GEL Apply 1 g topically 4 (four) times daily. 08/14/20  Yes [provider]  docusate sodium (COLACE) 100 MG capsule Take 100 mg by mouth 2 (two)  times daily.   Yes [provider]  fenofibrate (TRICOR) 48 MG tablet Take 48 mg by mouth daily. 08/14/20  Yes [provider]  FEROSUL 325 (65 Fe) MG tablet Take 325 mg by mouth daily. 08/15/20  Yes [provider]  folic acid (FOLVITE) 1 MG tablet Take 1 mg by mouth daily. 08/14/20  Yes [provider]  furosemide (LASIX) 20 MG tablet Take 20 mg by mouth daily. 08/14/20  Yes [provider]  gabapentin (NEURONTIN) 600 MG tablet Take 600 mg by mouth 3 (three) times daily. 09/01/20  Yes [provider]  glipiZIDE (GLUCOTROL XL) 2.5 MG 24 hr tablet Take 2.5 mg by mouth every morning. 08/14/20  Yes [provider]  hydroxychloroquine (PLAQUENIL) 200 MG tablet Take 400 mg by mouth daily. 08/14/20  Yes [provider]  ibuprofen (ADVIL,MOTRIN) 800 MG tablet Take 800 mg by mouth 2 (two) times daily.   Yes [provider]  indomethacin (INDOCIN) 50 MG capsule Take 50 mg by mouth 2 (two) times daily. 08/29/20  Yes [provider]  ipratropium-albuterol (DUONEB) 0.5-2.5 (3) MG/3ML SOLN Take 3 mLs by nebulization 3 (three) times daily. 08/14/20  Yes [provider]  LANTUS SOLOSTAR 100 UNIT/ML Solostar Pen Inject 44 Units into the skin 2 (two) times daily. 09/04/20  Yes [provider]  losartan-hydrochlorothiazide (HYZAAR) 100-12.5 MG tablet Take 1 tablet by mouth daily. 08/14/20  Yes [provider]  metFORMIN (GLUCOPHAGE) 1000 MG tablet Take 1,000 mg by mouth 2 (two) times daily with a meal.   Yes [provider]  Multiple Vitamins-Calcium (ONE-A-DAY WOMENS FORMULA) TABS Take 1 tablet by mouth daily.   Yes [provider]  nystatin ointment (MYCOSTATIN) Apply 1 application topically 2 (two) times daily.   Yes [provider]  omeprazole (PRILOSEC) 40 MG capsule Take 40 mg by mouth daily. 08/15/20  Yes [provider]  Oxycodone HCl 10 MG TABS Take 10 mg by  mouth 4 (four) times daily. 08/25/20  Yes [provider]  predniSONE (DELTASONE) 10 MG tablet Take 10 mg by mouth daily. 08/14/20  Yes [provider]  PREMARIN 0.625 MG tablet Take 0.625 mg by mouth daily. 08/14/20  Yes [provider]  promethazine (PHENERGAN) 25 MG tablet Take 25 mg by mouth every 12 (twelve) hours.   Yes [provider]  simvastatin (ZOCOR) 40 MG tablet Take 40 mg by mouth every evening. 08/14/20  Yes [provider]  sitaGLIPtin (JANUVIA) 100 MG tablet Take 100 mg by mouth daily.   Yes [provider]  Vitamin D, Ergocalciferol, (DRISDOL) 1.25 MG (50000 UNIT) CAPS capsule Take 50,000 Units by mouth once a week. 08/14/20  Yes [provider]  TRULICITY [id number] SOPN Inject 0.75 mg into the skin once a week. 09/04/20  [provider]    Physical Exam: Vitals:   09/07/20 1858 09/07/20 2000 09/07/20 2100 09/07/20 2216  BP:  (!) 167/102 (!) 152/99   Pulse:  (!) 109 (!) 104   Resp:  (!) 28 18   Temp:  99.2 F (37.3 C) 99.2 F (37.3 C)   TempSrc:  Oral Oral   SpO2: 94% 98% 97% 91%  Weight:      Height:        Constitutional: NAD, calm, comfortable Eyes: PERRL, lids and conjunctivae normal ENMT: Mucous membranes are moist. Posterior pharynx clear of any exudate or lesions.Normal dentition.  Neck: normal, supple, no masses, no thyromegaly Respiratory: Diffuse wheezing, no accessory muscle use. Cardiovascular: Regular rate and rhythm, no murmurs / rubs / gallops. No extremity edema. 2+ pedal pulses. No carotid bruits.  Abdomen: no tenderness, no masses palpated. No hepatosplenomegaly. Bowel sounds positive.  Musculoskeletal: no clubbing / cyanosis. No joint deformity upper and lower extremities. Good ROM, no contractures. Normal muscle tone.  Skin: no rashes, lesions, ulcers. No induration Neurologic: CN 2-12 grossly intact. Sensation intact, DTR normal. Strength 5/5 in all 4.  Psychiatric:  Normal judgment and insight. Alert and oriented x 3. Normal mood.    Labs on Admission: I have personally reviewed following labs and imaging studies  CBC: Recent Labs  Lab 09/07/20 1924  WBC 8.9  NEUTROABS 7.6  HGB 13.0  HCT 41.7  MCV 102.7*  PLT 183   Basic Metabolic Panel: Recent Labs  Lab 09/07/20 1924  NA 136  K 4.0  CL 93*  CO2 32  GLUCOSE 146*  BUN 15  CREATININE 0.91  CALCIUM 8.9   GFR: Estimated Creatinine Clearance: 105.8 mL/min (by C-G formula based on SCr of 0.91 mg/dL). Liver Function Tests: No results for input(s): AST, ALT, ALKPHOS, BILITOT, PROT, ALBUMIN in the last 168 hours. No results for input(s): LIPASE, AMYLASE in the last 168 hours. No results for input(s): AMMONIA in the last 168 hours. Coagulation Profile: No results for input(s): INR, PROTIME in the last 168 hours. Cardiac Enzymes: No results for input(s): CKTOTAL, CKMB, CKMBINDEX, TROPONINI in the last 168 hours. BNP (last 3 results) No results for input(s): PROBNP in the last 8760 hours. HbA1C: No results for input(s): HGBA1C in the last 72 hours. CBG: Recent Labs  Lab 09/07/20 1755  GLUCAP 178*   Lipid Profile: No results for input(s): CHOL, HDL, LDLCALC, TRIG, CHOLHDL, LDLDIRECT in the last 72 hours. Thyroid Function Tests: No results for input(s): TSH, T4TOTAL, FREET4, T3FREE, THYROIDAB in the last 72 hours. Anemia Panel: No results for input(s): VITAMINB12, FOLATE, FERRITIN, TIBC, IRON, RETICCTPCT in the last 72 hours. Urine analysis:    Component Value Date/Time   PROTEINUR neg 05/02/2015 1634   NITRITE neg 05/02/2015 1634   LEUKOCYTESUR Negative 05/02/2015 1634    Radiological Exams on Admission: DG Chest 2 View  Result Date: 09/07/2020 CLINICAL DATA:  Hypoxia and shortness of breath. EXAM: CHEST - 2 VIEW COMPARISON:  None. FINDINGS: Moderate severity multifocal infiltrates are seen throughout both lungs. There is no evidence of a pleural effusion or pneumothorax. The  heart size and mediastinal contours are within normal limits. The visualized skeletal structures are unremarkable. IMPRESSION: Moderate severity bilateral multifocal infiltrates. Electronically Signed   By: Aram Candela M.D.   On: 09/07/2020 18:42    EKG: Independently reviewed.  Assessment/Plan Principal Problem:   Multifocal pneumonia Active Problems:   COPD with acute exacerbation (HCC)   Acute respiratory failure with hypoxia (  [place])   CREST syndrome ([place])   DM2 (diabetes mellitus, type 2) ([place])   HTN (hypertension)    1. Multifocal PNA - 1. PNA pathway 2. Empiric rocephin / azithro 3. Though neg pro calcitonin and nl WBC, though pt is immunosuppressed at baseline on steroids and plaquenil 4. Repeat COVID PCR 5. Check RVP 2. COPD exacerbation - 1. COPD pathway 2. Steroids 3. Scheduled LABA/inh steroid 4. Looks like she has allergy to LAMA (so wont order). 5. PRN SABA 6. ABx as above 3. Acute resp failure with hypoxia - 1. Due to above 4. CREST syndrome - 1. Increased steroid dosing for COPD exacerbation 2. Will hold plaquenil for the moment 5. DM2 - 1. Lantus 35u BID (takes 44u bid at home) 2. Resistant scale SSI AC/HS 3. Hold home PO hypoglycemics 6. HTN - 1. Cont home BP meds  DVT prophylaxis: Lovenox Code Status: Full Family Communication: No family in room Disposition Plan: Home after O2 requirement improved Consults called: None Admission status: Admit to inpatient  Severity of Illness: The appropriate patient status for this patient is INPATIENT. Inpatient status is judged to be reasonable and necessary in order to provide the required intensity of service to ensure the patient's safety. The patient's presenting symptoms, physical exam findings, and initial radiographic and laboratory data in the context of their chronic comorbidities is felt to place them at high risk for further clinical deterioration. Furthermore, it is not anticipated that the patient  will be medically stable for discharge from the hospital within 2 midnights of admission. The following factors support the patient status of inpatient.   IP status due to PNA with new O2 requirement.  * I certify that at the point of admission it is my clinical judgment that the patient will require inpatient hospital care spanning beyond 2 midnights from the point of admission due to high intensity of service, high risk for further deterioration and high frequency of surveillance required.*    Orian Figueira M. DO [facility]  How to contact the [facility] Attending or Consulting provider [place] or covering provider during after hours [place], for this patient?  1. Check the care team in [facility] and look for a) attending/consulting [facility] provider listed and b) the [facility] team listed 2. Log into [email or web address]  Amion Physician Scheduling and messaging for groups and whole hospitals  On call and physician scheduling software for group practices, residents, hospitalists and other medical providers for call, clinic, rotation and shift schedules. OnCall [employer] is a hospital-wide system for scheduling doctors and paging doctors on call. EasyPlot is for scientific plotting and data analysis.  [email or web address]  and use [place]'s universal password to access. If you do not have the password, please contact the hospital operator.  3. Locate the [facility] provider you are looking for under [facility] and page to a number that you can be directly reached. 4. If you still have difficulty reaching the provider, please page the [facility] (Director on Call) for the Hospitalists listed on amion for assistance.  09/07/2020, 11:16 PM

## 2020-09-08 DIAGNOSIS — J9601 Acute respiratory failure with hypoxia: Secondary | ICD-10-CM | POA: Diagnosis not present

## 2020-09-08 DIAGNOSIS — J441 Chronic obstructive pulmonary disease with (acute) exacerbation: Secondary | ICD-10-CM | POA: Diagnosis not present

## 2020-09-08 DIAGNOSIS — Z794 Long term (current) use of insulin: Secondary | ICD-10-CM

## 2020-09-08 DIAGNOSIS — J189 Pneumonia, unspecified organism: Principal | ICD-10-CM

## 2020-09-08 DIAGNOSIS — M341 CR(E)ST syndrome: Secondary | ICD-10-CM | POA: Diagnosis not present

## 2020-09-08 DIAGNOSIS — E119 Type 2 diabetes mellitus without complications: Secondary | ICD-10-CM

## 2020-09-08 LAB — RESPIRATORY PANEL BY PCR

## 2020-09-08 LAB — GLUCOSE, CAPILLARY
Glucose-Capillary: 237 mg/dL — ABNORMAL HIGH (ref 70–99)
Glucose-Capillary: 216 mg/dL — ABNORMAL HIGH (ref 70–99)
Glucose-Capillary: 375 mg/dL — ABNORMAL HIGH (ref 70–99)
Glucose-Capillary: 313 mg/dL — ABNORMAL HIGH (ref 70–99)

## 2020-09-08 LAB — HEMOGLOBIN A1C
Hgb A1c MFr Bld: 6.5 % — ABNORMAL HIGH (ref 4.8–5.6)
Mean Plasma Glucose: 139.85 mg/dL

## 2020-09-08 LAB — HIV ANTIBODY (ROUTINE TESTING W REFLEX): HIV Screen 4th Generation wRfx: NONREACTIVE

## 2020-09-08 LAB — SARS CORONAVIRUS 2 (TAT 6-24 HRS): SARS Coronavirus 2: NEGATIVE

## 2020-09-08 MED ORDER — IPRATROPIUM-ALBUTEROL 0.5-2.5 (3) MG/3ML IN SOLN
3.0000 mL | Freq: Four times a day (QID) | RESPIRATORY_TRACT | Status: DC
  Administered 2020-09-08 – 2020-09-10 (×8): 3 mL via RESPIRATORY_TRACT
  Filled 2020-09-08 (×8): qty 3

## 2020-09-08 MED ORDER — GUAIFENESIN-DM 100-10 MG/5ML PO SYRP
5.0000 mL | ORAL_SOLUTION | ORAL | Status: DC | PRN
  Administered 2020-09-08 – 2020-09-09 (×2): 5 mL via ORAL
  Filled 2020-09-08 (×2): qty 5

## 2020-09-08 MED ORDER — ZOLPIDEM TARTRATE 5 MG PO TABS
5.0000 mg | ORAL_TABLET | Freq: Every evening | ORAL | Status: DC | PRN
  Administered 2020-09-08: 5 mg via ORAL
  Filled 2020-09-08: qty 1

## 2020-09-08 MED ORDER — ENSURE MAX PROTEIN PO LIQD
11.0000 [oz_av] | Freq: Every day | ORAL | Status: DC
  Administered 2020-09-09 – 2020-09-10 (×2): 11 [oz_av] via ORAL

## 2020-09-08 MED ORDER — ENOXAPARIN SODIUM 80 MG/0.8ML ~~LOC~~ SOLN
70.0000 mg | SUBCUTANEOUS | Status: DC
  Administered 2020-09-09 – 2020-09-11 (×3): 70 mg via SUBCUTANEOUS
  Filled 2020-09-08 (×3): qty 0.8

## 2020-09-08 MED ORDER — METHYLPREDNISOLONE SODIUM SUCC 125 MG IJ SOLR
80.0000 mg | Freq: Three times a day (TID) | INTRAMUSCULAR | Status: DC
  Administered 2020-09-08 – 2020-09-10 (×7): 80 mg via INTRAVENOUS
  Filled 2020-09-08 (×7): qty 2

## 2020-09-08 MED ORDER — DICLOFENAC SODIUM 1 % EX GEL
2.0000 g | Freq: Four times a day (QID) | CUTANEOUS | Status: DC | PRN
  Filled 2020-09-08: qty 100

## 2020-09-08 MED ORDER — INSULIN ASPART 100 UNIT/ML ~~LOC~~ SOLN
14.0000 [IU] | Freq: Three times a day (TID) | SUBCUTANEOUS | Status: DC
  Administered 2020-09-08 (×2): 14 [IU] via SUBCUTANEOUS

## 2020-09-08 NOTE — Progress Notes (Signed)
PT Cancellation Note  Patient Details Name: Jennifer Odonnell MRN: [id number] DOB: Mar 22, 1966   Cancelled Treatment:    Reason Eval/Treat Not Completed: Patient declined, no reason specified; Patient stated she has not eaten in at least 24 hours and feels weak and does not wish to participate until she has food. Will check back later of time permits.     10:45 AM, 09/08/20 Mearl Latin PT, DPT Physical Therapist at [facility]

## 2020-09-08 NOTE — Progress Notes (Signed)
Initial Nutrition Assessment  DOCUMENTATION CODES:   Morbid obesity  INTERVENTION:  Ensure Max po daily, each supplement provides 150 kcal and 30 grams of protein   NUTRITION DIAGNOSIS:   Increased nutrient needs related to chronic illness,acute illness (multifocal pneumonia; COPD;morbid obesity) as evidenced by estimated needs.   GOAL:   Patient will meet greater than or equal to 90% of their needs    MONITOR:   PO intake,Supplement acceptance,Weight trends,Labs,I & O's  REASON FOR ASSESSMENT:   Consult Assessment of nutrition requirement/status  ASSESSMENT:  55 year old female admitted for multifocal pneumonia presents with 4 days of nasal congestion, chest congestions, cough, headache, and subjective fevers. Past medical history of COPD, CREST syndrome, DM2, HTN, and morbid obesity.  Patient sitting up in bed eating graham crackers with peanut butter this morning. Reports she has not eaten anything since presenting to ED except for ginger ale and a couple of saltine crackers at 4 am. Patient reports good appetite at baseline, tries to eat healthy, monitors carbohydrate intake at home. She is followed by weight management for wt loss, reports completing 1 telephone initial assessment and had to reschedule second session in November due to her daughter having Covid. Patient reports multiple prior knee surgeries, now needing 2 total knee replacements.  Reports she has to lose 70 lbs before surgery would be considered. Pt became tearful, states she is unsure how anyone expects her to lose weight given she is barely able to walk, reports steroid injections in bilateral knees as well as left shoulder every 12 weeks adding to increased weight. RD provided supportive listening, offered ideas on other ways to move her body while seated (leg raises, toe touches, arm curls using soup cans/water bottles as weights) RD also suggested swimming, pt states she has 3 kids and on disability, unable  to afford [employer]. RD encouraged pt to contact [employer] about possible discount programs and reschedule appointment with wt management.   Meal intake 75%   Medications reviewed and include: Soma, Colace, Fenofibrate, Ferrous sulfate, Gabapentin, SSI, Lantus 35 units 2x/day, Methylprednisolone, MVI, Protonix, Zithromax, Rocephin  Labs: CBGs 216,237 A1c 6.5 on 09/07/20 (well controlled)  NUTRITION - FOCUSED PHYSICAL EXAM: Completed - no fat/muscle wasting   Diet Order:   Diet Order            Diet Carb Modified Fluid consistency: Thin; Room service appropriate? Yes  Diet effective now                 EDUCATION NEEDS:   Education needs have been addressed  Skin:     Last BM:  pta  Height:   Ht Readings from Last 1 Encounters:  09/07/20 5\' 6"  (1.676 m)    Weight:   Wt Readings from Last 1 Encounters:  09/07/20 (!) 145.2 kg    BMI:  Body mass index is 51.65 kg/m.  Estimated Nutritional Needs:   Kcal:  2250-2500 (MSJ x 1.1-1.2)  Protein:  118-130  Fluid:  >2.2 L   Lajuan Lines, RD, LDN Clinical Nutrition After Hours/Weekend Pager # in [place]

## 2020-09-08 NOTE — Evaluation (Signed)
Physical Therapy Evaluation Patient Details Name: Jennifer Odonnell MRN: [id number] DOB: October 02, 1965 Today's Date: 09/08/2020   History of Present Illness  Jennifer Odonnell is a 55 y.o. female with medical history significant of COPD, DM2, Crest syndrome, HTN, morbid obesity.     Pt unvaccinated to [place].     Pt presents to ED with c/o SOB.  4 days ago had onset of nasal congestion, chest congestion, cough, headache, subjective fevers.     No CP, no vomiting nor diarrhea.     Not on O2 at home.     Is on Plaquinel and steroids at baseline for h/o CREST syndrome.     Had very similar symptoms a few months back that ended up being due to Parainfluenza virus [place]     Clinical Impression  Patient with labored movements with bed mobility but does not require physical assist to transition to seated EOB. O2 sat while sitting EOB 90% on 5 L. Patient demonstrates good sitting balance and sitting tolerance while seated EOB. Patient able to transfer to standing with min guard assist for safety and balance due to patient becoming slightly unsteady with initial transfer to standing. Patient ambulates in room without AD with wide base of support and slighlty unsteady cadence. Patient fatigued following ambulation and O2 sat was 85% upon returning to sitting. Patient limited by fatigue today and returned to bed at end of session. Patient would benefit from a bariatric rolling walker in order to offload painful knees and assist with balance to reduce the risk of falls. Patient will require a bariatric rolling walker vs. Standard rolling walker secondary to patient's increased body mass. Patient will benefit from continued physical therapy in hospital and recommended venue below to increase strength, balance, endurance for safe ADLs and gait.     Follow Up Recommendations No PT follow up    Equipment Recommendations  Other (comment) (bariatric rolling walker)    Recommendations for Other Services        Precautions / Restrictions Precautions Precautions: Fall Restrictions Weight Bearing Restrictions: No      Mobility  Bed Mobility Overal bed mobility: Modified Independent             General bed mobility comments: labored movements    Transfers Overall transfer level: Needs assistance Equipment used: None Transfers: Sit to/from Stand;Stand Pivot Transfers Sit to Stand: Min guard Stand pivot transfers: Min guard       General transfer comment: labored, uses momentum, slightly unsteady upon standing  Ambulation/Gait Ambulation/Gait assistance: Supervision;Min guard Gait Distance (Feet): 15 Feet Assistive device: None Gait Pattern/deviations: Wide base of support Gait velocity: decreased   General Gait Details: slighlty unsteady, labored, without AD  Stairs            Wheelchair Mobility    Modified Rankin (Stroke Patients Only)       Balance Overall balance assessment: Needs assistance Sitting-balance support: No upper extremity supported;Feet supported Sitting balance-Leahy Scale: Normal Sitting balance - Comments: seated EOB     Standing balance-Leahy Scale: Fair Standing balance comment: unsteady without AD                             Pertinent Vitals/Pain Pain Assessment: Faces Faces Pain Scale: Hurts little more Pain Location: generalized Pain Intervention(s): Limited activity within patient's tolerance;Monitored during session;Repositioned    Home Living Family/patient expects to be discharged to:: Private residence Living Arrangements: Children Available Help at  Discharge: Family Type of Home: House Home Access: Stairs to enter Entrance Stairs-Rails: None Entrance Stairs-Number of Steps: 1 Home Layout: One level Home Equipment: None      Prior Function Level of Independence: Independent         Comments: Patient ambulates without AD for a max of 5 minutes at a time and is independent with basic ADL but  has been having more difficulty completing over last few months     Hand Dominance        Extremity/Trunk Assessment   Upper Extremity Assessment Upper Extremity Assessment: Overall WFL for tasks assessed    Lower Extremity Assessment Lower Extremity Assessment: Overall WFL for tasks assessed    Cervical / Trunk Assessment Cervical / Trunk Assessment: Normal  Communication   Communication: No difficulties  Cognition Arousal/Alertness: Awake/alert Behavior During Therapy: WFL for tasks assessed/performed Overall Cognitive Status: Within Functional Limits for tasks assessed                                        General Comments      Exercises     Assessment/Plan    PT Assessment Patient needs continued PT services  PT Problem List Decreased strength;Decreased activity tolerance;Decreased balance;Cardiopulmonary status limiting activity;Decreased mobility;Pain       PT Treatment Interventions DME instruction;Balance training;Gait training;Neuromuscular re-education;Stair training;Functional mobility training;Patient/family education;Therapeutic activities;Therapeutic exercise    PT Goals (Current goals can be found in the Care Plan section)  Acute Rehab PT Goals Patient Stated Goal: Return home PT Goal Formulation: With patient Time For Goal Achievement: 09/15/20 Potential to Achieve Goals: Good    Frequency Min 3X/week   Barriers to discharge        Co-evaluation               AM-PAC PT "6 Clicks" Mobility  Outcome Measure Help needed turning from your back to your side while in a flat bed without using bedrails?: None Help needed moving from lying on your back to sitting on the side of a flat bed without using bedrails?: A Little Help needed moving to and from a bed to a chair (including a wheelchair)?: None Help needed standing up from a chair using your arms (e.g., wheelchair or bedside chair)?: None Help needed to walk in  hospital room?: A Little Help needed climbing 3-5 steps with a railing? : A Lot 6 Click Score: 20    End of Session Equipment Utilized During Treatment: Oxygen Activity Tolerance: Patient tolerated treatment well;Patient limited by fatigue Patient left: in bed;with call bell/phone within reach Nurse Communication: Mobility status PT Visit Diagnosis: Unsteadiness on feet (R26.81);Other abnormalities of gait and mobility (R26.89)    Time: 1423-1450 PT Time Calculation (min) (ACUTE ONLY): 27 min   Charges:   PT Evaluation $PT Eval Moderate Complexity: 1 Mod PT Treatments $Therapeutic Activity: 8-22 mins        3:11 PM, 09/08/20 Mearl Latin PT, DPT Physical Therapist at [facility]

## 2020-09-08 NOTE — ED Provider Notes (Signed)
I was asked to place IV in patient 20g IV placed in right [facility] without difficulty using [place] placement Pt tolerated well IV flushed without difficulty   Ripley Fraise, MD 09/08/20 [phone]

## 2020-09-08 NOTE — TOC Initial Note (Signed)
Transition of Care [facility]) - Initial/Assessment Note    Patient Details  Name: Jennifer Odonnell MRN: [id number] Date of Birth: 1966/05/09  Transition of Care [facility]) CM/SW Contact:    Shade Flood, LCSW Phone Number: 09/08/2020, 3:07 PM  Clinical Narrative:                  Pt admitted from home. PT states pt will need bariatric walker for dc. Spoke with Barbaraann Rondo at [facility] who states that they would be able to provide this upon dc. Updated MD who states pt may also need O2 at dc. Spoke with Barbaraann Rondo again and he stated that if pt is on 2L or less at dc, they can do it. Any higher liter flow than that and they will not be able to assist.   Weekend TOC will follow and assist if pt ready for dc. Otherwise, assigned TOC will follow up Monday.  Expected Discharge Plan: Home/Self Care Barriers to Discharge: Continued Medical Work up   Patient Goals and CMS Choice        Expected Discharge Plan and Services Expected Discharge Plan: Home/Self Care In-house Referral: Clinical Social Work     Living arrangements for the past 2 months: Single Family Home                 DME Arranged: Walker rolling DME Agency: [employer] Date DME Agency Contacted: 09/08/20   Representative spoke with at DME Agency: Barbaraann Rondo            Prior Living Arrangements/Services Living arrangements for the past 2 months: Single Family Home Lives with:: Self Patient language and need for interpreter reviewed:: Yes Do you feel safe going back to the place where you live?: Yes      Need for Family Participation in Patient Care: No (Comment) Care giver support system in place?: No (comment)   Criminal Activity/Legal Involvement Pertinent to Current Situation/Hospitalization: No - Comment as needed  Activities of Daily Living      Permission Sought/Granted                  Emotional Assessment       Orientation: : Oriented to Self,Oriented to Place,Oriented to  Time,Oriented to Situation Alcohol /  Substance Use: Not Applicable Psych Involvement: No (comment)  Admission diagnosis:  Acute respiratory failure with hypoxia ([place]) [J96.01] Patient Active Problem List   Diagnosis Date Noted  . COPD with acute exacerbation ([place]) 09/07/2020  . Multifocal pneumonia 09/07/2020  . Acute respiratory failure with hypoxia ([place]) 09/07/2020  . CREST syndrome ([place]) 09/07/2020  . DM2 (diabetes mellitus, type 2) ([place]) 09/07/2020  . HTN (hypertension) 09/07/2020  . Mixed incontinence urge and stress 05/30/2015   PCP:  Kennieth Rad, MD Pharmacy:   [place], [place] - [address] [place] [address] Phone: [phone] Fax: [phone]  [place], [place] [address] Phone: [phone] Fax: [phone]     Social Determinants of Health (SDOH) Interventions    Readmission Risk Interventions No flowsheet data found.

## 2020-09-08 NOTE — Progress Notes (Signed)
PROGRESS NOTE   Jennifer Odonnell  [id number] DOB: 1966-01-31 DOA: 09/07/2020 PCP: Kennieth Rad, MD   Chief Complaint  Patient presents with  . Hypoxia   Brief Admission History:  55 y.o. female with medical history significant of COPD, DM2, Crest syndrome, HTN, morbid obesity.  Pt unvaccinated to [place].  Pt presents to ED with c/o SOB.  4 days ago had onset of nasal congestion, chest congestion, cough, headache, subjective fevers.  No CP, no vomiting nor diarrhea.  Not on O2 at home.  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  Pt presented with fever, cough, new oxygen requirement of 6L/min, CXR with multifocal pneumonia, negative covid x 2 and negative influenza testing.  Pt tested positive for metapneumovirus infection.   Assessment & Plan:   Principal Problem:   Multifocal pneumonia Active Problems:   COPD with acute exacerbation ([place])   Acute respiratory failure with hypoxia (HCC)   CREST syndrome (HCC)   DM2 (diabetes mellitus, type 2) (HCC)   HTN (hypertension)  1. Multifocal pneumonia - I suspect started as a viral infection but now has bacterial superinfection, continue azithromycin, ceftriaxone, continue supportive measures, continue steroids, nebs and follow.  2. Metapneumovirus - treating supportively. Pt likely contracted due to being immunocompromised.   3. COPD exacerbation - continue steroids and antibiotics.  4. Acute respiratory failure with hypoxia - new oxygen requirement - wean as able.  5. 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  6. Type 2 Diabetes mellitus uncontrolled - SSI/prandial and basal coverage ordered, titrating doses for better glycemic control.  7. Essential hypertension - resumed home medications.   DVT prophylaxis:  Enoxaparin  Code Status: FULL  Family Communication: none present during rounds Disposition: Home when medical stabilized  Status is: Inpatient  Remains inpatient appropriate because:IV  treatments appropriate due to intensity of illness or inability to take PO and Inpatient level of care appropriate due to severity of illness   Dispo: The patient is from: Home              Anticipated d/c is to: Home              Anticipated d/c date is: 2 days              Patient currently is not medically stable to d/c.  Consultants:   n/a  Procedures:   N/a   Antimicrobials:  Ceftriaxone 1/7 >> Azithromycin 1/7>>  Subjective: Pt reports increasing wheezing and shortness of breath shortly after completing neb treatment.    Objective: Vitals:   09/08/20 0205 09/08/20 0500 09/08/20 0825 09/08/20 1245  BP: (!) 158/72 (!) 156/65 122/71 120/83  Pulse: 96 90 96 82  Resp: 19 10 20 20   Temp:   98 F (36.7 C) 98.3 F (36.8 C)  TempSrc:   Oral Oral  SpO2: 93% 93% 93% 95%  Weight:      Height:        Intake/Output Summary (Last 24 hours) at 09/08/2020 1425 Last data filed at 09/08/2020 0255 Gross per 24 hour  Intake 400 ml  Output --  Net 400 ml   Filed Weights   09/07/20 1740  Weight: (!) 145.2 kg   Examination:  General exam: morbidly obese female, awake, Appears calm and comfortable  Respiratory system: diffuse insp/expiratory wheezing and tachypnea. Cardiovascular system: S1 & S2 heard, RRR. No JVD, murmurs, rubs, gallops or clicks. No pedal edema. Gastrointestinal system: Abdomen is nondistended, soft and nontender. No organomegaly  or masses felt. Normal bowel sounds heard. Central nervous system: Alert and oriented. No focal neurological deficits. Extremities: Symmetric 5 x 5 power. Skin: No rashes, lesions or ulcers Psychiatry: Judgement and insight appear poor. Mood & affect appropriate.   Data Reviewed: I have personally reviewed following labs and imaging studies  CBC: Recent Labs  Lab 09/07/20 1924  WBC 8.9  NEUTROABS 7.6  HGB 13.0  HCT 41.7  MCV 102.7*  PLT [id number]    Basic Metabolic Panel: Recent Labs  Lab 09/07/20 1924  NA 136  K 4.0  CL  93*  CO2 32  GLUCOSE 146*  BUN 15  CREATININE 0.91  CALCIUM 8.9    GFR: Estimated Creatinine Clearance: 105.8 mL/min (by C-G formula based on SCr of 0.91 mg/dL).  Liver Function Tests: No results for input(s): AST, ALT, ALKPHOS, BILITOT, PROT, ALBUMIN in the last 168 hours.  CBG: Recent Labs  Lab 09/07/20 1755 09/08/20 0821 09/08/20 1126  GLUCAP 178* 237* 216*    Recent Results (from the past 240 hour(s))  Resp Panel by RT-PCR (Flu A&B, Covid) Nasopharyngeal Swab     Status: None   Collection Time: 09/07/20  7:27 PM   Specimen: Nasopharyngeal Swab; Nasopharyngeal(NP) swabs in vial transport medium  Result Value Ref Range Status   SARS Coronavirus 2 by RT PCR NEGATIVE NEGATIVE Final    Comment: (NOTE) SARS-CoV-2 target nucleic acids are NOT DETECTED.  The SARS-CoV-2 RNA is generally detectable in upper respiratory specimens during the acute phase of infection. The lowest concentration of SARS-CoV-2 viral copies this assay can detect is 138 copies/mL. A negative result does not preclude SARS-Cov-2 infection and should not be used as the sole basis for treatment or other patient management decisions. A negative result may occur with  improper specimen collection/handling, submission of specimen other than nasopharyngeal swab, presence of viral mutation(s) within the areas targeted by this assay, and inadequate number of viral copies(<138 copies/mL). A negative result must be combined with clinical observations, patient history, and epidemiological information. The expected result is Negative.  Fact Sheet for Patients:  EntrepreneurPulse.com.au  Fact Sheet for Healthcare Providers:  IncredibleEmployment.be  This test is no t yet approved or cleared by the [place] FDA and  has been authorized for detection and/or diagnosis of SARS-CoV-2 by FDA under an Emergency Use Authorization (EUA). This EUA will remain  in effect (meaning  this test can be used) for the duration of the COVID-19 declaration under Section 564(b)(1) of the Act, 21 U.S.C.section 360bbb-3(b)(1), unless the authorization is terminated  or revoked sooner.       Influenza A by PCR NEGATIVE NEGATIVE Final   Influenza B by PCR NEGATIVE NEGATIVE Final    Comment: (NOTE) The Xpert Xpress SARS-CoV-2/FLU/RSV plus assay is intended as an aid in the diagnosis of influenza from Nasopharyngeal swab specimens and should not be used as a sole basis for treatment. Nasal washings and aspirates are unacceptable for Xpert Xpress SARS-CoV-2/FLU/RSV testing.  Fact Sheet for Patients: EntrepreneurPulse.com.au  Fact Sheet for Healthcare Providers: IncredibleEmployment.be  This test is not yet approved or cleared by the [place] FDA and has been authorized for detection and/or diagnosis of SARS-CoV-2 by FDA under an Emergency Use Authorization (EUA). This EUA will remain in effect (meaning this test can be used) for the duration of the COVID-19 declaration under Section 564(b)(1) of the Act, 21 U.S.C. section 360bbb-3(b)(1), unless the authorization is terminated or revoked.  Performed at [facility], [address] [place],  [place] [address]   Respiratory (~20 pathogens) panel by PCR     Status: Abnormal   Collection Time: 09/07/20 11:12 PM   Specimen: Nasopharyngeal Swab; Respiratory  Result Value Ref Range Status   Adenovirus NOT DETECTED NOT DETECTED Final   Coronavirus 229E NOT DETECTED NOT DETECTED Final    Comment: (NOTE) The Coronavirus on the Respiratory Panel, DOES NOT test for the novel  Coronavirus (2019 nCoV)    Coronavirus HKU1 NOT DETECTED NOT DETECTED Final   Coronavirus NL63 NOT DETECTED NOT DETECTED Final   Coronavirus OC43 NOT DETECTED NOT DETECTED Final   Metapneumovirus DETECTED (A) NOT DETECTED Final   Rhinovirus / Enterovirus NOT DETECTED NOT DETECTED Final   Influenza A NOT DETECTED NOT  DETECTED Final   Influenza B NOT DETECTED NOT DETECTED Final   Parainfluenza Virus 1 NOT DETECTED NOT DETECTED Final   Parainfluenza Virus 2 NOT DETECTED NOT DETECTED Final   Parainfluenza Virus 3 NOT DETECTED NOT DETECTED Final   Parainfluenza Virus 4 NOT DETECTED NOT DETECTED Final   Respiratory Syncytial Virus NOT DETECTED NOT DETECTED Final   Bordetella pertussis NOT DETECTED NOT DETECTED Final   Bordetella Parapertussis NOT DETECTED NOT DETECTED Final   Chlamydophila pneumoniae NOT DETECTED NOT DETECTED Final   Mycoplasma pneumoniae NOT DETECTED NOT DETECTED Final    Comment: Performed at [facility], [place]. [address] [place], [place] [address]    Radiology Studies: DG Chest 2 View  Result Date: 09/07/2020 CLINICAL DATA:  Hypoxia and shortness of breath. EXAM: CHEST - 2 VIEW COMPARISON:  None. FINDINGS: Moderate severity multifocal infiltrates are seen throughout both lungs. There is no evidence of a pleural effusion or pneumothorax. The heart size and mediastinal contours are within normal limits. The visualized skeletal structures are unremarkable. IMPRESSION: Moderate severity bilateral multifocal infiltrates. Electronically Signed   By: Virgina Norfolk M.D.   On: 09/07/2020 18:42   Scheduled Meds: . aspirin EC  81 mg Oral Daily  . atenolol  25 mg Oral Daily  . carisoprodol  350 mg Oral TID  . colchicine  0.6 mg Oral Daily  . diclofenac Sodium  2 g Topical QID  . docusate sodium  100 mg Oral BID  . [START ON 09/09/2020] enoxaparin (LOVENOX) injection  70 mg Subcutaneous Q24H  . estrogens (conjugated)  0.625 mg Oral Daily  . fenofibrate  54 mg Oral Daily  . ferrous sulfate  325 mg Oral Q breakfast  . folic acid  1 mg Oral Daily  . gabapentin  600 mg Oral TID  . losartan  100 mg Oral Daily   And  . hydrochlorothiazide  12.5 mg Oral Daily  . insulin aspart  0-20 Units Subcutaneous TID WC  . insulin aspart  14 Units Subcutaneous TID WC  . insulin glargine  35 Units  Subcutaneous BID  . ipratropium-albuterol  3 mL Nebulization Q6H  . methylPREDNISolone (SOLU-MEDROL) injection  80 mg Intravenous TID  . mometasone-formoterol  2 puff Inhalation BID  . multivitamin with minerals  1 tablet Oral Daily  . nystatin ointment  1 application Topical BID  . pantoprazole  80 mg Oral Daily  . simvastatin  40 mg Oral QPM   Continuous Infusions: . azithromycin Stopped (09/08/20 0255)  . cefTRIAXone (ROCEPHIN)  IV Stopped (09/08/20 0134)    LOS: 1 day   Time spent: 37 minutes   Braxtyn Dorff Wynetta Emery, MD How to contact the [facility] Attending or Consulting provider [place] or covering provider during after hours  7P -7A, for this patient?  1. Check the care team in [facility] and look for a) attending/consulting TRH provider listed and b) the [facility] team listed 2. Log into [email or web address] and use [place]'s universal password to access. If you do not have the password, please contact the hospital operator. 3. Locate the [facility] provider you are looking for under [facility] and page to a number that you can be directly reached. 4. If you still have difficulty reaching the provider, please page the [facility] (Director on Call) for the Hospitalists listed on amion for assistance.  09/08/2020, 2:25 PM   '

## 2020-09-08 NOTE — Plan of Care (Signed)
  Problem: Acute Rehab PT Goals(only PT should resolve) Goal: Patient Will Transfer Sit To/From Stand Outcome: Progressing Flowsheets (Taken 09/08/2020 1513) Patient will transfer sit to/from stand: with modified independence Goal: Pt Will Transfer Bed To Chair/Chair To Bed Outcome: Progressing Flowsheets (Taken 09/08/2020 1513) Pt will Transfer Bed to Chair/Chair to Bed: with modified independence Goal: Pt Will Ambulate Outcome: Progressing Flowsheets (Taken 09/08/2020 1513) Pt will Ambulate:  25 feet  with least restrictive assistive device Goal: Pt/caregiver will Perform Home Exercise Program Outcome: Progressing Flowsheets (Taken 09/08/2020 1513) Pt/caregiver will Perform Home Exercise Program:  For increased strengthening  For improved balance  Independently 3:14 PM, 09/08/20 Mearl Latin PT, DPT Physical Therapist at [facility]

## 2020-09-09 DIAGNOSIS — J189 Pneumonia, unspecified organism: Secondary | ICD-10-CM | POA: Diagnosis not present

## 2020-09-09 DIAGNOSIS — J441 Chronic obstructive pulmonary disease with (acute) exacerbation: Secondary | ICD-10-CM | POA: Diagnosis not present

## 2020-09-09 DIAGNOSIS — M341 CR(E)ST syndrome: Secondary | ICD-10-CM | POA: Diagnosis not present

## 2020-09-09 DIAGNOSIS — J9601 Acute respiratory failure with hypoxia: Secondary | ICD-10-CM | POA: Diagnosis not present

## 2020-09-09 LAB — GLUCOSE, CAPILLARY
Glucose-Capillary: 344 mg/dL — ABNORMAL HIGH (ref 70–99)
Glucose-Capillary: 294 mg/dL — ABNORMAL HIGH (ref 70–99)
Glucose-Capillary: 277 mg/dL — ABNORMAL HIGH (ref 70–99)
Glucose-Capillary: 260 mg/dL — ABNORMAL HIGH (ref 70–99)

## 2020-09-09 MED ORDER — INSULIN ASPART 100 UNIT/ML ~~LOC~~ SOLN
20.0000 [IU] | Freq: Three times a day (TID) | SUBCUTANEOUS | Status: DC
  Administered 2020-09-09 (×2): 20 [IU] via SUBCUTANEOUS

## 2020-09-09 NOTE — Progress Notes (Addendum)
PROGRESS NOTE   Jennifer Odonnell  SNK:[id number] DOB: [birth year] DOA: 09/07/2020 PCP: Kennieth Rad, MD   Chief Complaint  Patient presents with  . Hypoxia   Brief Admission History:  55 y.o. female with medical history significant of COPD, DM2, Crest syndrome, HTN, morbid obesity.  Pt unvaccinated to [place].  Pt presents to ED with c/o SOB.  4 days ago had onset of nasal congestion, chest congestion, cough, headache, subjective fevers.  No CP, no vomiting nor diarrhea.  Not on O2 at home.  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  Pt presented with fever, cough, new oxygen requirement of 6L/min, CXR with multifocal pneumonia, negative covid x 2 and negative influenza testing.  Pt tested positive for metapneumovirus infection.   Assessment & Plan:   Principal Problem:   Multifocal pneumonia Active Problems:   COPD with acute exacerbation ([place])   Acute respiratory failure with hypoxia (HCC)   CREST syndrome (HCC)   DM2 (diabetes mellitus, type 2) (HCC)   HTN (hypertension)  1. Multifocal pneumonia - I suspect started as a viral infection but now has bacterial superinfection, continue azithromycin, ceftriaxone, continue supportive measures, continue steroids, nebs and follow.  2. Metapneumovirus - treating supportively. Pt likely contracted due to being immunocompromised.   3. COPD exacerbation - continue steroids and antibiotics.  4. Acute respiratory failure with hypoxia - new oxygen requirement - wean as able.  5. 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  6. Type 2 Diabetes mellitus uncontrolled - SSI/prandial and basal coverage ordered, titrating doses for better glycemic control.  7. Essential hypertension - resumed home medications.   DVT prophylaxis:  Enoxaparin  Code Status: FULL  Family Communication: updated at bedside during rounds Disposition: Home when medical stabilized  Status is: Inpatient  Remains inpatient appropriate because:IV  treatments appropriate due to intensity of illness or inability to take PO and Inpatient level of care appropriate due to severity of illness   Dispo: The patient is from: Home              Anticipated d/c is to: Home              Anticipated d/c date is: 1-2 days              Patient currently is not medically stable to d/c.  Consultants:   n/a  Procedures:   N/a   Antimicrobials:  Ceftriaxone 1/7 >> Azithromycin 1/7>>  Subjective: Pt continues to have wheezing, cough, pursed lip breathing and SOB   Objective: Vitals:   09/08/20 2327 09/09/20 0553 09/09/20 0605 09/09/20 1446  BP:  126/76  (!) 144/85  Pulse:  86  82  Resp:  20  20  Temp:  98.2 F (36.8 C)  98.2 F (36.8 C)  TempSrc:  Oral  Oral  SpO2: 94% 91% 96% 96%  Weight:      Height:        Intake/Output Summary (Last 24 hours) at 09/09/2020 1541 Last data filed at 09/09/2020 1300 Gross per 24 hour  Intake 1200 ml  Output 2950 ml  Net -1750 ml   Filed Weights   09/07/20 1740  Weight: (!) 145.2 kg   Examination:  General exam: morbidly obese female, awake, Appears calm and comfortable  Respiratory system: persistent diffuse insp/expiratory wheezing and tachypnea. Mild pursed lip breathing. Cardiovascular system: S1 & S2 heard, RRR. No JVD, murmurs, rubs, gallops or clicks. No pedal edema. Gastrointestinal system: Abdomen is nondistended, soft and  nontender. No organomegaly or masses felt. Normal bowel sounds heard. Central nervous system: Alert and oriented. No focal neurological deficits. Extremities: Symmetric 5 x 5 power. Skin: No rashes, lesions or ulcers Psychiatry: Judgement and insight appear poor. Mood & affect appropriate.   Data Reviewed: I have personally reviewed following labs and imaging studies  CBC: Recent Labs  Lab 09/07/20 1924  WBC 8.9  NEUTROABS 7.6  HGB 13.0  HCT 41.7  MCV 102.7*  PLT [id number]    Basic Metabolic Panel: Recent Labs  Lab 09/07/20 1924  NA 136  K 4.0  CL 93*   CO2 32  GLUCOSE 146*  BUN 15  CREATININE 0.91  CALCIUM 8.9    GFR: Estimated Creatinine Clearance: 105.8 mL/min (by C-G formula based on SCr of 0.91 mg/dL).  Liver Function Tests: No results for input(s): AST, ALT, ALKPHOS, BILITOT, PROT, ALBUMIN in the last 168 hours.  CBG: Recent Labs  Lab 09/08/20 1126 09/08/20 1712 09/08/20 2057 09/09/20 0804 09/09/20 1123  GLUCAP 216* 375* 313* 344* 294*    Recent Results (from the past 240 hour(s))  Resp Panel by RT-PCR (Flu A&B, Covid) Nasopharyngeal Swab     Status: None   Collection Time: 09/07/20  7:27 PM   Specimen: Nasopharyngeal Swab; Nasopharyngeal(NP) swabs in vial transport medium  Result Value Ref Range Status   SARS Coronavirus 2 by RT PCR NEGATIVE NEGATIVE Final    Comment: (NOTE) SARS-CoV-2 target nucleic acids are NOT DETECTED.  The SARS-CoV-2 RNA is generally detectable in upper respiratory specimens during the acute phase of infection. The lowest concentration of SARS-CoV-2 viral copies this assay can detect is 138 copies/mL. A negative result does not preclude SARS-Cov-2 infection and should not be used as the sole basis for treatment or other patient management decisions. A negative result may occur with  improper specimen collection/handling, submission of specimen other than nasopharyngeal swab, presence of viral mutation(s) within the areas targeted by this assay, and inadequate number of viral copies(<138 copies/mL). A negative result must be combined with clinical observations, patient history, and epidemiological information. The expected result is Negative.  Fact Sheet for Patients:  EntrepreneurPulse.com.au  Fact Sheet for Healthcare Providers:  IncredibleEmployment.be  This test is no t yet approved or cleared by the [place] FDA and  has been authorized for detection and/or diagnosis of SARS-CoV-2 by FDA under an Emergency Use Authorization (EUA). This  EUA will remain  in effect (meaning this test can be used) for the duration of the COVID-19 declaration under Section 564(b)(1) of the Act, 21 U.S.C.section 360bbb-3(b)(1), unless the authorization is terminated  or revoked sooner.       Influenza A by PCR NEGATIVE NEGATIVE Final   Influenza B by PCR NEGATIVE NEGATIVE Final    Comment: (NOTE) The Xpert Xpress SARS-CoV-2/FLU/RSV plus assay is intended as an aid in the diagnosis of influenza from Nasopharyngeal swab specimens and should not be used as a sole basis for treatment. Nasal washings and aspirates are unacceptable for Xpert Xpress SARS-CoV-2/FLU/RSV testing.  Fact Sheet for Patients: EntrepreneurPulse.com.au  Fact Sheet for Healthcare Providers: IncredibleEmployment.be  This test is not yet approved or cleared by the [place] FDA and has been authorized for detection and/or diagnosis of SARS-CoV-2 by FDA under an Emergency Use Authorization (EUA). This EUA will remain in effect (meaning this test can be used) for the duration of the COVID-19 declaration under Section 564(b)(1) of the Act, 21 U.S.C. section 360bbb-3(b)(1), unless the authorization is terminated or revoked.  Performed at [facility], [address], [place], [place] [address]   SARS CORONAVIRUS 2 (TAT 6-24 HRS) Nasopharyngeal Nasopharyngeal Swab     Status: None   Collection Time: 09/07/20 10:23 PM   Specimen: Nasopharyngeal Swab  Result Value Ref Range Status   SARS Coronavirus 2 NEGATIVE NEGATIVE Final    Comment: (NOTE) SARS-CoV-2 target nucleic acids are NOT DETECTED.  The SARS-CoV-2 RNA is generally detectable in upper and lower respiratory specimens during the acute phase of infection. Negative results do not preclude SARS-CoV-2 infection, do not rule out co-infections with other pathogens, and should not be used as the sole basis for treatment or other patient management decisions. Negative results  must be combined with clinical observations, patient history, and epidemiological information. The expected result is Negative.  Fact Sheet for Patients: SugarRoll.be  Fact Sheet for Healthcare Providers: https://www.woods-mathews.com/  This test is not yet approved or cleared by the [place] FDA and  has been authorized for detection and/or diagnosis of SARS-CoV-2 by FDA under an Emergency Use Authorization (EUA). This EUA will remain  in effect (meaning this test can be used) for the duration of the COVID-19 declaration under Se ction 564(b)(1) of the Act, 21 U.S.C. section 360bbb-3(b)(1), unless the authorization is terminated or revoked sooner.  Performed at [facility], [place] [address] [place], [place] [address]   Respiratory (~20 pathogens) panel by PCR     Status: Abnormal   Collection Time: 09/07/20 11:12 PM   Specimen: Nasopharyngeal Swab; Respiratory  Result Value Ref Range Status   Adenovirus NOT DETECTED NOT DETECTED Final   Coronavirus 229E NOT DETECTED NOT DETECTED Final    Comment: (NOTE) The Coronavirus on the Respiratory Panel, DOES NOT test for the novel  Coronavirus (2019 nCoV)    Coronavirus HKU1 NOT DETECTED NOT DETECTED Final   Coronavirus NL63 NOT DETECTED NOT DETECTED Final   Coronavirus OC43 NOT DETECTED NOT DETECTED Final   Metapneumovirus DETECTED (A) NOT DETECTED Final   Rhinovirus / Enterovirus NOT DETECTED NOT DETECTED Final   Influenza A NOT DETECTED NOT DETECTED Final   Influenza B NOT DETECTED NOT DETECTED Final   Parainfluenza Virus 1 NOT DETECTED NOT DETECTED Final   Parainfluenza Virus 2 NOT DETECTED NOT DETECTED Final   Parainfluenza Virus 3 NOT DETECTED NOT DETECTED Final   Parainfluenza Virus 4 NOT DETECTED NOT DETECTED Final   Respiratory Syncytial Virus NOT DETECTED NOT DETECTED Final   Bordetella pertussis NOT DETECTED NOT DETECTED Final   Bordetella Parapertussis NOT DETECTED NOT  DETECTED Final   Chlamydophila pneumoniae NOT DETECTED NOT DETECTED Final   Mycoplasma pneumoniae NOT DETECTED NOT DETECTED Final    Comment: Performed at [facility], [place]. [address], [place], [place] [address]    Radiology Studies: DG Chest 2 View  Result Date: 09/07/2020 CLINICAL DATA:  Hypoxia and shortness of breath. EXAM: CHEST - 2 VIEW COMPARISON:  None. FINDINGS: Moderate severity multifocal infiltrates are seen throughout both lungs. There is no evidence of a pleural effusion or pneumothorax. The heart size and mediastinal contours are within normal limits. The visualized skeletal structures are unremarkable. IMPRESSION: Moderate severity bilateral multifocal infiltrates. Electronically Signed   By: Virgina Norfolk M.D.   On: 09/07/2020 18:42   Scheduled Meds: . aspirin EC  81 mg Oral Daily  . atenolol  25 mg Oral Daily  . carisoprodol  350 mg Oral TID  . colchicine  0.6 mg Oral Daily  . docusate sodium  100  mg Oral BID  . enoxaparin (LOVENOX) injection  70 mg Subcutaneous Q24H  . estrogens (conjugated)  0.625 mg Oral Daily  . fenofibrate  54 mg Oral Daily  . ferrous sulfate  325 mg Oral Q breakfast  . folic acid  1 mg Oral Daily  . gabapentin  600 mg Oral TID  . losartan  100 mg Oral Daily   And  . hydrochlorothiazide  12.5 mg Oral Daily  . insulin aspart  0-20 Units Subcutaneous TID WC  . insulin aspart  20 Units Subcutaneous TID WC  . insulin glargine  35 Units Subcutaneous BID  . ipratropium-albuterol  3 mL Nebulization Q6H  . methylPREDNISolone (SOLU-MEDROL) injection  80 mg Intravenous TID  . mometasone-formoterol  2 puff Inhalation BID  . multivitamin with minerals  1 tablet Oral Daily  . nystatin ointment  1 application Topical BID  . pantoprazole  80 mg Oral Daily  . Ensure Max Protein  11 oz Oral Daily  . simvastatin  40 mg Oral QPM   Continuous Infusions: . azithromycin 500 mg (09/08/20 2229)  . cefTRIAXone (ROCEPHIN)  IV 2 g (09/08/20 2341)    LOS:  2 days   Time spent: 36 minutes   Emelyn Roen Wynetta Emery, MD How to contact the [facility] Attending or Consulting provider [place] or covering provider during after hours [place], for this patient?  1. Check the care team in [facility] and look for a) attending/consulting [facility] provider listed and b) the [facility] team listed 2. Log into [email or web address] and use [place]'s universal password to access. If you do not have the password, please contact the hospital operator. 3. Locate the [facility] provider you are looking for under [facility] and page to a number that you can be directly reached. 4. If you still have difficulty reaching the provider, please page the [facility] (Director on Call) for the Hospitalists listed on amion for assistance.  09/09/2020, 3:41 PM   '

## 2020-09-10 ENCOUNTER — Inpatient Hospital Stay (HOSPITAL_COMMUNITY): Payer: Medicaid - Out of State

## 2020-09-10 DIAGNOSIS — J9601 Acute respiratory failure with hypoxia: Secondary | ICD-10-CM | POA: Diagnosis not present

## 2020-09-10 DIAGNOSIS — M341 CR(E)ST syndrome: Secondary | ICD-10-CM | POA: Diagnosis not present

## 2020-09-10 DIAGNOSIS — J189 Pneumonia, unspecified organism: Secondary | ICD-10-CM | POA: Diagnosis not present

## 2020-09-10 DIAGNOSIS — J441 Chronic obstructive pulmonary disease with (acute) exacerbation: Secondary | ICD-10-CM | POA: Diagnosis not present

## 2020-09-10 LAB — GLUCOSE, CAPILLARY
Glucose-Capillary: 253 mg/dL — ABNORMAL HIGH (ref 70–99)
Glucose-Capillary: 293 mg/dL — ABNORMAL HIGH (ref 70–99)
Glucose-Capillary: 231 mg/dL — ABNORMAL HIGH (ref 70–99)
Glucose-Capillary: 283 mg/dL — ABNORMAL HIGH (ref 70–99)

## 2020-09-10 MED ORDER — METHYLPREDNISOLONE SODIUM SUCC 125 MG IJ SOLR
60.0000 mg | Freq: Three times a day (TID) | INTRAMUSCULAR | Status: DC
  Administered 2020-09-10 – 2020-09-11 (×3): 60 mg via INTRAVENOUS
  Filled 2020-09-10 (×3): qty 2

## 2020-09-10 MED ORDER — IPRATROPIUM-ALBUTEROL 0.5-2.5 (3) MG/3ML IN SOLN
3.0000 mL | Freq: Three times a day (TID) | RESPIRATORY_TRACT | Status: DC
  Administered 2020-09-11 (×2): 3 mL via RESPIRATORY_TRACT
  Filled 2020-09-10 (×2): qty 3

## 2020-09-10 MED ORDER — GUAIFENESIN ER 600 MG PO TB12
1200.0000 mg | ORAL_TABLET | Freq: Two times a day (BID) | ORAL | Status: DC
Start: 2020-09-10 — End: 2020-09-11
  Administered 2020-09-10 – 2020-09-11 (×3): 1200 mg via ORAL
  Filled 2020-09-10 (×3): qty 2

## 2020-09-10 MED ORDER — POTASSIUM CHLORIDE CRYS ER 20 MEQ PO TBCR
40.0000 meq | EXTENDED_RELEASE_TABLET | Freq: Every day | ORAL | Status: AC
  Administered 2020-09-10: 40 meq via ORAL
  Filled 2020-09-10: qty 2

## 2020-09-10 MED ORDER — FUROSEMIDE 40 MG PO TABS
40.0000 mg | ORAL_TABLET | Freq: Every day | ORAL | Status: AC
  Administered 2020-09-10: 40 mg via ORAL
  Filled 2020-09-10: qty 1

## 2020-09-10 MED ORDER — INSULIN ASPART 100 UNIT/ML ~~LOC~~ SOLN
25.0000 [IU] | Freq: Three times a day (TID) | SUBCUTANEOUS | Status: DC
  Administered 2020-09-10 – 2020-09-11 (×5): 25 [IU] via SUBCUTANEOUS

## 2020-09-10 MED ORDER — GUAIFENESIN-CODEINE 100-10 MG/5ML PO SOLN
10.0000 mL | Freq: Four times a day (QID) | ORAL | Status: DC | PRN
Start: 2020-09-10 — End: 2020-09-11
  Administered 2020-09-10 – 2020-09-11 (×2): 10 mL via ORAL
  Filled 2020-09-10 (×2): qty 10

## 2020-09-10 NOTE — Progress Notes (Signed)
PROGRESS NOTE   Jennifer Odonnell  QQP:[id number] DOB: 30-Aug-1966 DOA: 09/07/2020 PCP: Kennieth Rad, MD   Chief Complaint  Patient presents with  . Hypoxia   Brief Admission History:  55 y.o. female with medical history significant of COPD, DM2, Crest syndrome, HTN, morbid obesity.  Pt unvaccinated to [place].  Pt presents to ED with c/o SOB.  4 days ago had onset of nasal congestion, chest congestion, cough, headache, subjective fevers.  No CP, no vomiting nor diarrhea.  Not on O2 at home.  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  Pt presented with fever, cough, new oxygen requirement of 6L/min, CXR with multifocal pneumonia, negative covid x 2 and negative influenza testing.  Pt tested positive for metapneumovirus infection.   Assessment & Plan:   Principal Problem:   Multifocal pneumonia Active Problems:   COPD with acute exacerbation ([place])   Acute respiratory failure with hypoxia (HCC)   CREST syndrome (HCC)   DM2 (diabetes mellitus, type 2) (HCC)   HTN (hypertension)  1. Multifocal pneumonia - I suspect started as a viral infection but now has bacterial superinfection, continue azithromycin, ceftriaxone, continue supportive measures, continue steroids, nebs and follow.  Repeated CXR shows persistent multifocal infiltrate.  Pt will need follow up CXR in 3-4 weeks to ensure resolution.   If stable, hopeful to DC home 1/10.  2. Metapneumovirus - treating supportively. Pt likely contracted due to being immunocompromised.   3. COPD exacerbation - continue steroids and antibiotics.  4. Acute respiratory failure with hypoxia - new oxygen requirement - wean as able.  5. 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  6. Type 2 Diabetes mellitus uncontrolled - SSI/prandial and basal coverage ordered, titrating doses for better glycemic control.  7. Essential hypertension - resumed home medications.   DVT prophylaxis:  Enoxaparin  Code Status: FULL  Family  Communication: updated at bedside during rounds Disposition: Home when medical stabilized  Status is: Inpatient  Remains inpatient appropriate because:IV treatments appropriate due to intensity of illness or inability to take PO and Inpatient level of care appropriate due to severity of illness  Dispo: The patient is from: Home              Anticipated d/c is to: Home              Anticipated d/c date is: 1-2 days              Patient currently is not medically stable to d/c.  Consultants:   n/a  Procedures:   N/a   Antimicrobials:  Ceftriaxone 1/7 >> Azithromycin 1/7>>  Subjective: Pt reports her breathing slowly improved but still coughing and wheezing.  Poor exercise tolerance.  Back pain (chronic)  Objective: Vitals:   09/10/20 0523 09/10/20 0809 09/10/20 0812 09/10/20 0938  BP: (!) 120/59   (!) 129/100  Pulse: 87   98  Resp: 17     Temp: 98.4 F (36.9 C)     TempSrc:      SpO2: 94% 96% 96%   Weight:      Height:        Intake/Output Summary (Last 24 hours) at 09/10/2020 1128 Last data filed at 09/10/2020 1048 Gross per 24 hour  Intake 960 ml  Output 2250 ml  Net -1290 ml   Filed Weights   09/07/20 1740  Weight: (!) 145.2 kg   Examination:  General exam: morbidly obese female, awake, Appears calm and comfortable  Respiratory system: tachypnea  present, speaking short sentences.  Cardiovascular system: normal S1 & S2 heard, tachycardic. No JVD, murmurs, rubs, gallops or clicks. No pedal edema. Gastrointestinal system: Abdomen is nondistended, soft and nontender. No organomegaly or masses felt. Normal bowel sounds heard. Central nervous system: Alert and oriented. No focal neurological deficits. Extremities: Symmetric 5 x 5 power. Skin: No rashes, lesions or ulcers Psychiatry: Judgement and insight appear poor. Mood & affect appropriate.   Data Reviewed: I have personally reviewed following labs and imaging studies  CBC: Recent Labs  Lab 09/07/20 1924   WBC 8.9  NEUTROABS 7.6  HGB 13.0  HCT 41.7  MCV 102.7*  PLT [id number]    Basic Metabolic Panel: Recent Labs  Lab 09/07/20 1924  NA 136  K 4.0  CL 93*  CO2 32  GLUCOSE 146*  BUN 15  CREATININE 0.91  CALCIUM 8.9    GFR: Estimated Creatinine Clearance: 105.8 mL/min (by C-G formula based on SCr of 0.91 mg/dL).  Liver Function Tests: No results for input(s): AST, ALT, ALKPHOS, BILITOT, PROT, ALBUMIN in the last 168 hours.  CBG: Recent Labs  Lab 09/09/20 0804 09/09/20 1123 09/09/20 1657 09/09/20 2010 09/10/20 0744  GLUCAP 344* 294* 277* 260* 253*    Recent Results (from the past 240 hour(s))  Resp Panel by RT-PCR (Flu A&B, Covid) Nasopharyngeal Swab     Status: None   Collection Time: 09/07/20  7:27 PM   Specimen: Nasopharyngeal Swab; Nasopharyngeal(NP) swabs in vial transport medium  Result Value Ref Range Status   SARS Coronavirus 2 by RT PCR NEGATIVE NEGATIVE Final    Comment: (NOTE) SARS-CoV-2 target nucleic acids are NOT DETECTED.  The SARS-CoV-2 RNA is generally detectable in upper respiratory specimens during the acute phase of infection. The lowest concentration of SARS-CoV-2 viral copies this assay can detect is 138 copies/mL. A negative result does not preclude SARS-Cov-2 infection and should not be used as the sole basis for treatment or other patient management decisions. A negative result may occur with  improper specimen collection/handling, submission of specimen other than nasopharyngeal swab, presence of viral mutation(s) within the areas targeted by this assay, and inadequate number of viral copies(<138 copies/mL). A negative result must be combined with clinical observations, patient history, and epidemiological information. The expected result is Negative.  Fact Sheet for Patients:  EntrepreneurPulse.com.au  Fact Sheet for Healthcare Providers:  IncredibleEmployment.be  This test is no t yet approved or  cleared by the [place] FDA and  has been authorized for detection and/or diagnosis of SARS-CoV-2 by FDA under an Emergency Use Authorization (EUA). This EUA will remain  in effect (meaning this test can be used) for the duration of the COVID-19 declaration under Section 564(b)(1) of the Act, 21 U.S.C.section 360bbb-3(b)(1), unless the authorization is terminated  or revoked sooner.       Influenza A by PCR NEGATIVE NEGATIVE Final   Influenza B by PCR NEGATIVE NEGATIVE Final    Comment: (NOTE) The Xpert Xpress SARS-CoV-2/FLU/RSV plus assay is intended as an aid in the diagnosis of influenza from Nasopharyngeal swab specimens and should not be used as a sole basis for treatment. Nasal washings and aspirates are unacceptable for Xpert Xpress SARS-CoV-2/FLU/RSV testing.  Fact Sheet for Patients: EntrepreneurPulse.com.au  Fact Sheet for Healthcare Providers: IncredibleEmployment.be  This test is not yet approved or cleared by the [place] FDA and has been authorized for detection and/or diagnosis of SARS-CoV-2 by FDA under an Emergency Use Authorization (EUA). This EUA will remain in effect (meaning  this test can be used) for the duration of the COVID-19 declaration under Section 564(b)(1) of the Act, 21 U.S.C. section 360bbb-3(b)(1), unless the authorization is terminated or revoked.  Performed at [facility], [address] [place], [place] [address]   SARS CORONAVIRUS 2 (TAT 6-24 HRS) Nasopharyngeal Nasopharyngeal Swab     Status: None   Collection Time: 09/07/20 10:23 PM   Specimen: Nasopharyngeal Swab  Result Value Ref Range Status   SARS Coronavirus 2 NEGATIVE NEGATIVE Final    Comment: (NOTE) SARS-CoV-2 target nucleic acids are NOT DETECTED.  The SARS-CoV-2 RNA is generally detectable in upper and lower respiratory specimens during the acute phase of infection. Negative results do not preclude SARS-CoV-2 infection, do not  rule out co-infections with other pathogens, and should not be used as the sole basis for treatment or other patient management decisions. Negative results must be combined with clinical observations, patient history, and epidemiological information. The expected result is Negative.  Fact Sheet for Patients: SugarRoll.be  Fact Sheet for Healthcare Providers: https://www.woods-mathews.com/  This test is not yet approved or cleared by the [place] FDA and  has been authorized for detection and/or diagnosis of SARS-CoV-2 by FDA under an Emergency Use Authorization (EUA). This EUA will remain  in effect (meaning this test can be used) for the duration of the COVID-19 declaration under Se ction 564(b)(1) of the Act, 21 U.S.C. section 360bbb-3(b)(1), unless the authorization is terminated or revoked sooner.  Performed at [facility], [place] [address], [place], [place] [address]   Respiratory (~20 pathogens) panel by PCR     Status: Abnormal   Collection Time: 09/07/20 11:12 PM   Specimen: Nasopharyngeal Swab; Respiratory  Result Value Ref Range Status   Adenovirus NOT DETECTED NOT DETECTED Final   Coronavirus 229E NOT DETECTED NOT DETECTED Final    Comment: (NOTE) The Coronavirus on the Respiratory Panel, DOES NOT test for the novel  Coronavirus (2019 nCoV)    Coronavirus HKU1 NOT DETECTED NOT DETECTED Final   Coronavirus NL63 NOT DETECTED NOT DETECTED Final   Coronavirus OC43 NOT DETECTED NOT DETECTED Final   Metapneumovirus DETECTED (A) NOT DETECTED Final   Rhinovirus / Enterovirus NOT DETECTED NOT DETECTED Final   Influenza A NOT DETECTED NOT DETECTED Final   Influenza B NOT DETECTED NOT DETECTED Final   Parainfluenza Virus 1 NOT DETECTED NOT DETECTED Final   Parainfluenza Virus 2 NOT DETECTED NOT DETECTED Final   Parainfluenza Virus 3 NOT DETECTED NOT DETECTED Final   Parainfluenza Virus 4 NOT DETECTED NOT DETECTED Final    Respiratory Syncytial Virus NOT DETECTED NOT DETECTED Final   Bordetella pertussis NOT DETECTED NOT DETECTED Final   Bordetella Parapertussis NOT DETECTED NOT DETECTED Final   Chlamydophila pneumoniae NOT DETECTED NOT DETECTED Final   Mycoplasma pneumoniae NOT DETECTED NOT DETECTED Final    Comment: Performed at [facility], [place]. [address], [place], [place] [address]    Radiology Studies: DG Chest 2 View  Result Date: 09/10/2020 CLINICAL DATA:  Cough EXAM: CHEST - 2 VIEW COMPARISON:  September 07, 2020 FINDINGS: The cardiomediastinal silhouette is unchanged in contour.Irregular nodular contour along the RIGHT hilar border. No pleural effusion. No pneumothorax. Multifocal reticulonodular opacities, similar to minimally decreased in comparison to prior. Biapical pleuroparenchymal thickening. Visualized abdomen is unremarkable. Mild degenerative changes of the thoracic spine. IMPRESSION: 1. Multifocal reticulonodular opacities are similar to minimally decreased in comparison to prior. Differential considerations include infection (including atypical infection) or edema. 2. There  is a more focal irregular nodular opacity along the RIGHT hilar border. Recommend follow-up PA and lateral radiograph versus chest CT 3-4 weeks after appropriate treatment to assess for resolution. Electronically Signed   By: Valentino Saxon MD   On: 09/10/2020 09:41   Scheduled Meds: . aspirin EC  81 mg Oral Daily  . atenolol  25 mg Oral Daily  . carisoprodol  350 mg Oral TID  . colchicine  0.6 mg Oral Daily  . docusate sodium  100 mg Oral BID  . enoxaparin (LOVENOX) injection  70 mg Subcutaneous Q24H  . estrogens (conjugated)  0.625 mg Oral Daily  . fenofibrate  54 mg Oral Daily  . ferrous sulfate  325 mg Oral Q breakfast  . folic acid  1 mg Oral Daily  . furosemide  40 mg Oral Daily  . gabapentin  600 mg Oral TID  . losartan  100 mg Oral Daily   And  . hydrochlorothiazide  12.5 mg Oral Daily  . insulin  aspart  0-20 Units Subcutaneous TID WC  . insulin aspart  25 Units Subcutaneous TID WC  . insulin glargine  35 Units Subcutaneous BID  . ipratropium-albuterol  3 mL Nebulization Q6H  . methylPREDNISolone (SOLU-MEDROL) injection  60 mg Intravenous TID  . mometasone-formoterol  2 puff Inhalation BID  . multivitamin with minerals  1 tablet Oral Daily  . nystatin ointment  1 application Topical BID  . pantoprazole  80 mg Oral Daily  . potassium chloride  40 mEq Oral Daily  . Ensure Max Protein  11 oz Oral Daily  . simvastatin  40 mg Oral QPM   Continuous Infusions: . azithromycin 500 mg (09/09/20 2350)  . cefTRIAXone (ROCEPHIN)  IV 2 g (09/09/20 2246)    LOS: 3 days   Time spent: 35 minutes   Rydell Wiegel Wynetta Emery, MD How to contact the [facility] Attending or Consulting provider [place] or covering provider during after hours [place], for this patient?  1. Check the care team in [facility] and look for a) attending/consulting TRH provider listed and b) the [facility] team listed 2. Log into [email or web address] and use [place]'s universal password to access. If you do not have the password, please contact the hospital operator. 3. Locate the [facility] provider you are looking for under [facility] and page to a number that you can be directly reached. 4. If you still have difficulty reaching the provider, please page the [facility] (Director on Call) for the Hospitalists listed on amion for assistance.  09/10/2020, 11:28 AM   '

## 2020-09-11 DIAGNOSIS — J9601 Acute respiratory failure with hypoxia: Secondary | ICD-10-CM | POA: Diagnosis not present

## 2020-09-11 DIAGNOSIS — M341 CR(E)ST syndrome: Secondary | ICD-10-CM | POA: Diagnosis not present

## 2020-09-11 DIAGNOSIS — J189 Pneumonia, unspecified organism: Secondary | ICD-10-CM | POA: Diagnosis not present

## 2020-09-11 DIAGNOSIS — J441 Chronic obstructive pulmonary disease with (acute) exacerbation: Secondary | ICD-10-CM | POA: Diagnosis not present

## 2020-09-11 LAB — GLUCOSE, CAPILLARY
Glucose-Capillary: 198 mg/dL — ABNORMAL HIGH (ref 70–99)
Glucose-Capillary: 131 mg/dL — ABNORMAL HIGH (ref 70–99)
Glucose-Capillary: 218 mg/dL — ABNORMAL HIGH (ref 70–99)

## 2020-09-11 MED ORDER — DOXYCYCLINE HYCLATE 100 MG PO CAPS: 100.0000 mg | ORAL_CAPSULE | Freq: Two times a day (BID) | ORAL | 0 refills | Status: AC

## 2020-09-11 MED ORDER — DOXYCYCLINE HYCLATE 100 MG PO CAPS: 100.0000 mg | ORAL_CAPSULE | Freq: Two times a day (BID) | ORAL | 0 refills | Status: DC

## 2020-09-11 MED ORDER — GUAIFENESIN ER 600 MG PO TB12: 1200.0000 mg | ORAL_TABLET | Freq: Two times a day (BID) | ORAL | 0 refills | Status: AC

## 2020-09-11 MED ORDER — GUAIFENESIN ER 600 MG PO TB12: 1200.0000 mg | ORAL_TABLET | Freq: Two times a day (BID) | ORAL | 0 refills | Status: DC

## 2020-09-11 MED ORDER — PREDNISONE 20 MG PO TABS: ORAL_TABLET | ORAL | 0 refills | Status: DC

## 2020-09-11 MED ORDER — GUAIFENESIN-CODEINE 100-10 MG/5ML PO SOLN: 10.0000 mL | Freq: Four times a day (QID) | ORAL | 0 refills | Status: DC | PRN

## 2020-09-11 MED ORDER — ALBUTEROL SULFATE (2.5 MG/3ML) 0.083% IN NEBU: 2.5000 mg | INHALATION_SOLUTION | RESPIRATORY_TRACT | 4 refills | Status: DC | PRN

## 2020-09-11 NOTE — Progress Notes (Signed)
Patient states understanding of discharge instructions, d/ced from unit accompanied by staff

## 2020-09-11 NOTE — TOC Transition Note (Signed)
Transition of Care [facility]) - CM/SW Discharge Note  Patient Details  Name: Jennifer Odonnell MRN: [id number] Date of Birth: April 25, 1966  [facility] CM/SW Contact:  Sherie Don, LCSW Phone Number: 09/11/2020, 2:47 PM  Clinical Narrative: Per Barbaraann Rondo with [employer], a four-pronged cane will be drop-shipped to the patient's home. Patient will require O2 at discharge. CSW spoke with patient and patient agreeable to [facility] providing O2. CSW made referral to [facility] with [facility] and clinicals were faxed. [employer] to deliver portable tank to patient's room. Medicaid form completed by hospitalist and faxed back to [facility]. TOC signing off.  Final next level of care: Home/Self Care Barriers to Discharge: Barriers Resolved  Patient Goals and CMS Choice Patient states their goals for this hospitalization and ongoing recovery are:: Discharge home CMS Medicare.gov Compare Post Acute Care list provided to:: Patient Choice offered to / list presented to : Patient  Discharge Plan and Services In-house Referral: Clinical Social Work       DME Arranged: Cane,Oxygen DME Agency: [facility] (O2)) Date DME Agency Contacted: 09/11/20 Representative spoke with at DME Agency: Beryle Flock HH Arranged: NA [place] Agency: NA  Readmission Risk Interventions No flowsheet data found.

## 2020-09-11 NOTE — Progress Notes (Signed)
OT Cancellation Note  Patient Details Name: Jennifer Odonnell MRN: [id number] DOB: 05-Mar-1966   Cancelled Treatment:    Reason Eval/Treat Not Completed: OT screened, no needs identified, will sign off. Spoke with patient in room while she was eating breakfast. Patient reports that she is independent at baseline and she is planning to discharge home today. Her breathing has greatly improved and she is able to complete all required ADL tasks at prior level of independent to Mod I level. She reports no concerns regarding discharge and does not voice any need for OT services at follow up. Thank you for the referral.     Ailene Ravel, OTR/L,CBIS  [phone]  09/11/2020, 9:07 AM

## 2020-09-11 NOTE — Progress Notes (Signed)
SATURATION QUALIFICATIONS: (This note is used to comply with regulatory documentation for home oxygen)  Patient Saturations on Room Air at Rest = 89%  Patient Saturations on Room Air while Ambulating = 79%

## 2020-09-11 NOTE — Discharge Summary (Addendum)
Physician Discharge Summary  Jennifer Odonnell [id number] DOB: 1966-03-13 DOA: 09/07/2020  PCP: Kennieth Rad, MD  Admit date: 09/07/2020 Discharge date: 09/11/2020  Admitted From:  Home  Disposition: Home   Recommendations for Outpatient Follow-up:  1. Follow up with PCP in 1 weeks 2. Please repeat Chest xray in 3-4 weeks to ensure resolution of pneumonia  Discharge Condition: STABLE   CODE STATUS: FULL    Brief Hospitalization Summary: Please see all hospital notes, images, labs for full details of the hospitalization. Brief Admission History:  55 y.o.femalewith medical history significant ofCOPD, DM2, Crest syndrome, HTN, morbid obesity.  Pt unvaccinated to [place].  Pt presents to ED with c/o SOB. 4 days ago had onset of nasal congestion, chest congestion, cough, headache, subjective fevers.  No CP, no vomiting nor diarrhea.  Not on O2 at home.  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  Pt presented with fever, cough, new oxygen requirement of 6L/min, CXR with multifocal pneumonia, negative covid x 2 and negative influenza testing.  Pt tested positive for metapneumovirus infection.   Assessment & Plan:   Principal Problem:   Multifocal pneumonia Active Problems:   COPD with acute exacerbation    Acute respiratory failure with hypoxia    CREST syndrome   DM2 (diabetes mellitus, type 2)    HTN (hypertension)  1. Multifocal pneumonia - I suspect started as a viral infection but progressed to a bacterial superinfection, treated in hospital with azithromycin, ceftriaxone and supportive measures, IV steroids, nebs and followed.  Repeated CXR shows persistent multifocal infiltrate.  Pt clinically has improved and feels much better, oxygen being weaned down.  Home O2 evaluation screen prior to discharge.  Pt will need follow up CXR in 3-4 weeks to ensure resolution.   DC home on prednisone taper and oral doxycycline, resume home bronchodilators.    2. Metapneumovirus - treated supportively. Pt likely contracted due to being immunocompromised.   3. COPD exacerbation - continue steroids and antibiotics.  4. Acute respiratory failure with hypoxia - new oxygen requirement - wean as able.  5. 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  6. Type 2 Diabetes mellitus uncontrolled - resume home therapy.  7. Essential hypertension - resumed home medications.   DVT prophylaxis:  Enoxaparin  Code Status: FULL  Family Communication: updated at bedside during rounds Disposition: Home with close outpatient follow up   Discharge Diagnoses:  Principal Problem:   Multifocal pneumonia Active Problems:   COPD with acute exacerbation ([place])   Acute respiratory failure with hypoxia ([facility])   CREST syndrome (H[facility])   DM2 (diabetes mellitus, type 2) ([place])   HTN (hypertension)  Discharge Instructions:  Allergies as of 09/11/2020      Reactions   Daucus Carota Swelling   carrot   Lisinopril Anaphylaxis   Tiotropium Swelling   Erythromycin Diarrhea   Ambien [zolpidem] Other (See Comments)   Woke up disoriented and scared.   Spiriva Handihaler [tiotropium Bromide Monohydrate]       Medication List    STOP taking these medications   furosemide 20 MG tablet Commonly known as: LASIX   ibuprofen 800 MG tablet Commonly known as: ADVIL   indomethacin 50 MG capsule Commonly known as: INDOCIN     TAKE these medications   albuterol 108 (90 Base) MCG/ACT inhaler Commonly known as: VENTOLIN HFA Inhale 2 puffs into the lungs every 6 (six) hours as needed for wheezing or shortness of breath. What changed: Another medication with the same  name was changed. Make sure you understand how and when to take each.   albuterol (2.5 MG/3ML) 0.083% nebulizer solution Commonly known as: PROVENTIL Take 3 mLs (2.5 mg total) by nebulization every 4 (four) hours as needed for wheezing or shortness of breath. What changed: when to take this   ascorbic  acid 500 MG tablet Commonly known as: VITAMIN C Take 500 mg by mouth daily.   aspirin 81 MG EC tablet Take 81 mg by mouth daily.   atenolol 25 MG tablet Commonly known as: TENORMIN Take 25 mg by mouth daily.   budesonide 0.5 MG/2ML nebulizer solution Commonly known as: PULMICORT Take 0.5 mg by nebulization 2 (two) times daily.   carisoprodol 350 MG tablet Commonly known as: SOMA Take 350 mg by mouth 3 (three) times daily.   colchicine 0.6 MG tablet Take 0.6 mg by mouth daily.   diclofenac Sodium 1 % Gel Commonly known as: VOLTAREN Apply 1 g topically 4 (four) times daily.   docusate sodium 100 MG capsule Commonly known as: COLACE Take 100 mg by mouth 2 (two) times daily.   doxycycline 100 MG capsule Commonly known as: VIBRAMYCIN Take 1 capsule (100 mg total) by mouth 2 (two) times daily for 3 days.   fenofibrate 48 MG tablet Commonly known as: TRICOR Take 48 mg by mouth daily.   FeroSul 325 (65 FE) MG tablet Generic drug: ferrous sulfate Take 325 mg by mouth daily.   folic acid 1 MG tablet Commonly known as: FOLVITE Take 1 mg by mouth daily.   gabapentin 600 MG tablet Commonly known as: NEURONTIN Take 600 mg by mouth 3 (three) times daily.   glipiZIDE 2.5 MG 24 hr tablet Commonly known as: GLUCOTROL XL Take 2.5 mg by mouth every morning.   guaiFENesin 600 MG 12 hr tablet Commonly known as: MUCINEX Take 2 tablets (1,200 mg total) by mouth 2 (two) times daily for 5 days.   guaiFENesin-codeine 100-10 MG/5ML syrup Take 10 mLs by mouth every 6 (six) hours as needed for cough.   hydroxychloroquine 200 MG tablet Commonly known as: PLAQUENIL Take 400 mg by mouth daily.   ipratropium-albuterol 0.5-2.5 (3) MG/3ML Soln Commonly known as: DUONEB Take 3 mLs by nebulization 3 (three) times daily.   Lantus SoloStar 100 UNIT/ML Solostar Pen Generic drug: insulin glargine Inject 44 Units into the skin 2 (two) times daily.   losartan-hydrochlorothiazide 100-12.5  MG tablet Commonly known as: HYZAAR Take 1 tablet by mouth daily.   metFORMIN 1000 MG tablet Commonly known as: GLUCOPHAGE Take 1,000 mg by mouth 2 (two) times daily with a meal.   nystatin ointment Commonly known as: MYCOSTATIN Apply 1 application topically 2 (two) times daily.   omeprazole 40 MG capsule Commonly known as: PRILOSEC Take 40 mg by mouth daily.   One-A-Day Womens Formula Tabs Take 1 tablet by mouth daily.   Oxycodone HCl 10 MG Tabs Take 10 mg by mouth 4 (four) times daily.   predniSONE 20 MG tablet Commonly known as: DELTASONE Take 3 PO QAM x5days, 2 PO QAM x5days, 1 PO QAM x5days, then resume 10 mg QD Start taking on: September 12, 2020 What changed:   medication strength  how much to take  how to take this  when to take this  additional instructions   Premarin 0.625 MG tablet Generic drug: estrogens (conjugated) Take 0.625 mg by mouth daily.   promethazine 25 MG tablet Commonly known as: PHENERGAN Take 25 mg by mouth every 12 (twelve) hours.  simvastatin 40 MG tablet Commonly known as: ZOCOR Take 40 mg by mouth every evening.   sitaGLIPtin 100 MG tablet Commonly known as: JANUVIA Take 100 mg by mouth daily.   Trulicity [id number] Sopn Generic drug: Dulaglutide Inject 0.75 mg into the skin once a week.   Vitamin D (Ergocalciferol) 1.25 MG (50000 UNIT) Caps capsule Commonly known as: DRISDOL Take 50,000 Units by mouth once a week.            Durable Medical Equipment  (From admission, onward)         Start     Ordered   09/11/20 0944  For home use only DME Cane  Once       Comments: QUAD CANE   09/11/20 [id number]          Follow-up Information    Kennieth Rad, MD. Schedule an appointment as soon as possible for a visit in 1 week(s).   Specialties: Internal Medicine, Infectious Diseases Why: Hospital Follow Up  Contact information: [facility] [address] [place] [address] [phone]               Allergies  Allergen Reactions  . Daucus Carota Swelling    carrot  . Lisinopril Anaphylaxis  . Tiotropium Swelling  . Erythromycin Diarrhea  . Ambien [Zolpidem] Other (See Comments)    Woke up disoriented and scared.  Marland Kitchen Spiriva Handihaler [Tiotropium Bromide Monohydrate]    Allergies as of 09/11/2020      Reactions   Daucus Carota Swelling   carrot   Lisinopril Anaphylaxis   Tiotropium Swelling   Erythromycin Diarrhea   Ambien [zolpidem] Other (See Comments)   Woke up disoriented and scared.   Spiriva Handihaler [tiotropium Bromide Monohydrate]       Medication List    STOP taking these medications   furosemide 20 MG tablet Commonly known as: LASIX   ibuprofen 800 MG tablet Commonly known as: ADVIL   indomethacin 50 MG capsule Commonly known as: INDOCIN     TAKE these medications   albuterol 108 (90 Base) MCG/ACT inhaler Commonly known as: VENTOLIN HFA Inhale 2 puffs into the lungs every 6 (six) hours as needed for wheezing or shortness of breath. What changed: Another medication with the same name was changed. Make sure you understand how and when to take each.   albuterol (2.5 MG/3ML) 0.083% nebulizer solution Commonly known as: PROVENTIL Take 3 mLs (2.5 mg total) by nebulization every 4 (four) hours as needed for wheezing or shortness of breath. What changed: when to take this   ascorbic acid 500 MG tablet Commonly known as: VITAMIN C Take 500 mg by mouth daily.   aspirin 81 MG EC tablet Take 81 mg by mouth daily.   atenolol 25 MG tablet Commonly known as: TENORMIN Take 25 mg by mouth daily.   budesonide 0.5 MG/2ML nebulizer solution Commonly known as: PULMICORT Take 0.5 mg by nebulization 2 (two) times daily.   carisoprodol 350 MG tablet Commonly known as: SOMA Take 350 mg by mouth 3 (three) times daily.   colchicine 0.6 MG tablet Take 0.6 mg by mouth daily.   diclofenac Sodium 1 % Gel Commonly known as: VOLTAREN Apply 1 g topically  4 (four) times daily.   docusate sodium 100 MG capsule Commonly known as: COLACE Take 100 mg by mouth 2 (two) times daily.   doxycycline 100 MG capsule Commonly known as: VIBRAMYCIN Take 1 capsule (100 mg total) by mouth 2 (two) times daily  for 3 days.   fenofibrate 48 MG tablet Commonly known as: TRICOR Take 48 mg by mouth daily.   FeroSul 325 (65 FE) MG tablet Generic drug: ferrous sulfate Take 325 mg by mouth daily.   folic acid 1 MG tablet Commonly known as: FOLVITE Take 1 mg by mouth daily.   gabapentin 600 MG tablet Commonly known as: NEURONTIN Take 600 mg by mouth 3 (three) times daily.   glipiZIDE 2.5 MG 24 hr tablet Commonly known as: GLUCOTROL XL Take 2.5 mg by mouth every morning.   guaiFENesin 600 MG 12 hr tablet Commonly known as: MUCINEX Take 2 tablets (1,200 mg total) by mouth 2 (two) times daily for 5 days.   guaiFENesin-codeine 100-10 MG/5ML syrup Take 10 mLs by mouth every 6 (six) hours as needed for cough.   hydroxychloroquine 200 MG tablet Commonly known as: PLAQUENIL Take 400 mg by mouth daily.   ipratropium-albuterol 0.5-2.5 (3) MG/3ML Soln Commonly known as: DUONEB Take 3 mLs by nebulization 3 (three) times daily.   Lantus SoloStar 100 UNIT/ML Solostar Pen Generic drug: insulin glargine Inject 44 Units into the skin 2 (two) times daily.   losartan-hydrochlorothiazide 100-12.5 MG tablet Commonly known as: HYZAAR Take 1 tablet by mouth daily.   metFORMIN 1000 MG tablet Commonly known as: GLUCOPHAGE Take 1,000 mg by mouth 2 (two) times daily with a meal.   nystatin ointment Commonly known as: MYCOSTATIN Apply 1 application topically 2 (two) times daily.   omeprazole 40 MG capsule Commonly known as: PRILOSEC Take 40 mg by mouth daily.   One-A-Day Womens Formula Tabs Take 1 tablet by mouth daily.   Oxycodone HCl 10 MG Tabs Take 10 mg by mouth 4 (four) times daily.   predniSONE 20 MG tablet Commonly known as: DELTASONE Take 3  PO QAM x5days, 2 PO QAM x5days, 1 PO QAM x5days, then resume 10 mg QD Start taking on: September 12, 2020 What changed:   medication strength  how much to take  how to take this  when to take this  additional instructions   Premarin 0.625 MG tablet Generic drug: estrogens (conjugated) Take 0.625 mg by mouth daily.   promethazine 25 MG tablet Commonly known as: PHENERGAN Take 25 mg by mouth every 12 (twelve) hours.   simvastatin 40 MG tablet Commonly known as: ZOCOR Take 40 mg by mouth every evening.   sitaGLIPtin 100 MG tablet Commonly known as: JANUVIA Take 100 mg by mouth daily.   Trulicity [id number] 0000000 Sopn Generic drug: Dulaglutide Inject 0.75 mg into the skin once a week.   Vitamin D (Ergocalciferol) 1.25 MG (50000 UNIT) Caps capsule Commonly known as: DRISDOL Take 50,000 Units by mouth once a week.            Durable Medical Equipment  (From admission, onward)         Start     Ordered   09/11/20 0944  For home use only DME Cane  Once       Comments: QUAD CANE   09/11/20 [id number]          Procedures/Studies: DG Chest 2 View  Result Date: 09/10/2020 CLINICAL DATA:  Cough EXAM: CHEST - 2 VIEW COMPARISON:  September 07, 2020 FINDINGS: The cardiomediastinal silhouette is unchanged in contour.Irregular nodular contour along the RIGHT hilar border. No pleural effusion. No pneumothorax. Multifocal reticulonodular opacities, similar to minimally decreased in comparison to prior. Biapical pleuroparenchymal thickening. Visualized abdomen is unremarkable. Mild degenerative changes of the thoracic spine. IMPRESSION: 1.  Multifocal reticulonodular opacities are similar to minimally decreased in comparison to prior. Differential considerations include infection (including atypical infection) or edema. 2. There is a more focal irregular nodular opacity along the RIGHT hilar border. Recommend follow-up PA and lateral radiograph versus chest CT 3-4 weeks after appropriate  treatment to assess for resolution. Electronically Signed   By: Valentino Saxon MD   On: 09/10/2020 09:41   DG Chest 2 View  Result Date: 09/07/2020 CLINICAL DATA:  Hypoxia and shortness of breath. EXAM: CHEST - 2 VIEW COMPARISON:  None. FINDINGS: Moderate severity multifocal infiltrates are seen throughout both lungs. There is no evidence of a pleural effusion or pneumothorax. The heart size and mediastinal contours are within normal limits. The visualized skeletal structures are unremarkable. IMPRESSION: Moderate severity bilateral multifocal infiltrates. Electronically Signed   By: Virgina Norfolk M.D.   On: 09/07/2020 18:42      Subjective: Pt reports that she is breathing much better now and she feels well to go home.  NO complaints.    Discharge Exam: Vitals:   09/11/20 0507 09/11/20 0918  BP: (!) 166/94   Pulse: 93   Resp: 17   Temp: 98.3 F (36.8 C)   SpO2: 90% 92%   Vitals:   09/10/20 2101 09/10/20 2104 09/11/20 0507 09/11/20 0918  BP: 137/73  (!) 166/94   Pulse: 73  93   Resp: 17  17   Temp: 98.3 F (36.8 C)  98.3 F (36.8 C)   TempSrc:   Oral   SpO2: 94% 92% 90% 92%  Weight:      Height:        General exam: morbidly obese female, awake, Appears calm and comfortable  Respiratory system: BBS with much improved rare wheeze, no rales.   Cardiovascular system: normal S1 & S2 heard, tachycardic. No JVD, murmurs, rubs, gallops or clicks. No pedal edema. Gastrointestinal system: Abdomen is nondistended, soft and nontender. No organomegaly or masses felt. Normal bowel sounds heard. Central nervous system: Alert and oriented. No focal neurological deficits. Extremities: Symmetric 5 x 5 power. Skin: No rashes, lesions or ulcers Psychiatry: Judgement and insight appear normal. Mood & affect appropriate.    The results of significant diagnostics from this hospitalization (including imaging, microbiology, ancillary and laboratory) are listed below for reference.      Microbiology: Recent Results (from the past 240 hour(s))  Resp Panel by RT-PCR (Flu A&B, Covid) Nasopharyngeal Swab     Status: None   Collection Time: 09/07/20  7:27 PM   Specimen: Nasopharyngeal Swab; Nasopharyngeal(NP) swabs in vial transport medium  Result Value Ref Range Status   SARS Coronavirus 2 by RT PCR NEGATIVE NEGATIVE Final    Comment: (NOTE) SARS-CoV-2 target nucleic acids are NOT DETECTED.  The SARS-CoV-2 RNA is generally detectable in upper respiratory specimens during the acute phase of infection. The lowest concentration of SARS-CoV-2 viral copies this assay can detect is 138 copies/mL. A negative result does not preclude SARS-Cov-2 infection and should not be used as the sole basis for treatment or other patient management decisions. A negative result may occur with  improper specimen collection/handling, submission of specimen other than nasopharyngeal swab, presence of viral mutation(s) within the areas targeted by this assay, and inadequate number of viral copies(<138 copies/mL). A negative result must be combined with clinical observations, patient history, and epidemiological information. The expected result is Negative.  Fact Sheet for Patients:  EntrepreneurPulse.com.au  Fact Sheet for Healthcare Providers:  IncredibleEmployment.be  This test is no  t yet approved or cleared by the [place] and  has been authorized for detection and/or diagnosis of SARS-CoV-2 by FDA under an Emergency Use Authorization (EUA). This EUA will remain  in effect (meaning this test can be used) for the duration of the COVID-19 declaration under Section 564(b)(1) of the Act, 21 U.S.C.section 360bbb-3(b)(1), unless the authorization is terminated  or revoked sooner.       Influenza A by PCR NEGATIVE NEGATIVE Final   Influenza B by PCR NEGATIVE NEGATIVE Final    Comment: (NOTE) The Xpert Xpress SARS-CoV-2/FLU/RSV plus assay is  intended as an aid in the diagnosis of influenza from Nasopharyngeal swab specimens and should not be used as a sole basis for treatment. Nasal washings and aspirates are unacceptable for Xpert Xpress SARS-CoV-2/FLU/RSV testing.  Fact Sheet for Patients: BloggerCourse.com  Fact Sheet for Healthcare Providers: SeriousBroker.it  This test is not yet approved or cleared by the [place] FDA and has been authorized for detection and/or diagnosis of SARS-CoV-2 by FDA under an Emergency Use Authorization (EUA). This EUA will remain in effect (meaning this test can be used) for the duration of the COVID-19 declaration under Section 564(b)(1) of the Act, 21 U.S.C. section 360bbb-3(b)(1), unless the authorization is terminated or revoked.  Performed at [facility], [address], [place], [place] [address]   SARS CORONAVIRUS 2 (TAT 6-24 HRS) Nasopharyngeal Nasopharyngeal Swab     Status: None   Collection Time: 09/07/20 10:23 PM   Specimen: Nasopharyngeal Swab  Result Value Ref Range Status   SARS Coronavirus 2 NEGATIVE NEGATIVE Final    Comment: (NOTE) SARS-CoV-2 target nucleic acids are NOT DETECTED.  The SARS-CoV-2 RNA is generally detectable in upper and lower respiratory specimens during the acute phase of infection. Negative results do not preclude SARS-CoV-2 infection, do not rule out co-infections with other pathogens, and should not be used as the sole basis for treatment or other patient management decisions. Negative results must be combined with clinical observations, patient history, and epidemiological information. The expected result is Negative.  Fact Sheet for Patients: HairSlick.no  Fact Sheet for Healthcare Providers: quierodirigir.com  This test is not yet approved or cleared by the [place] FDA and  has been authorized for detection and/or diagnosis  of SARS-CoV-2 by FDA under an Emergency Use Authorization (EUA). This EUA will remain  in effect (meaning this test can be used) for the duration of the COVID-19 declaration under Se ction 564(b)(1) of the Act, 21 U.S.C. section 360bbb-3(b)(1), unless the authorization is terminated or revoked sooner.  Performed at [facility], [address] [place], [place] [address]   Respiratory (~20 pathogens) panel by PCR     Status: Abnormal   Collection Time: 09/07/20 11:12 PM   Specimen: Nasopharyngeal Swab; Respiratory  Result Value Ref Range Status   Adenovirus NOT DETECTED NOT DETECTED Final   Coronavirus 229E NOT DETECTED NOT DETECTED Final    Comment: (NOTE) The Coronavirus on the Respiratory Panel, DOES NOT test for the novel  Coronavirus (2019 nCoV)    Coronavirus HKU1 NOT DETECTED NOT DETECTED Final   Coronavirus NL63 NOT DETECTED NOT DETECTED Final   Coronavirus OC43 NOT DETECTED NOT DETECTED Final   Metapneumovirus DETECTED (A) NOT DETECTED Final   Rhinovirus / Enterovirus NOT DETECTED NOT DETECTED Final   Influenza A NOT DETECTED NOT DETECTED Final   Influenza B NOT DETECTED NOT DETECTED Final   Parainfluenza Virus 1 NOT DETECTED NOT DETECTED Final  Parainfluenza Virus 2 NOT DETECTED NOT DETECTED Final   Parainfluenza Virus 3 NOT DETECTED NOT DETECTED Final   Parainfluenza Virus 4 NOT DETECTED NOT DETECTED Final   Respiratory Syncytial Virus NOT DETECTED NOT DETECTED Final   Bordetella pertussis NOT DETECTED NOT DETECTED Final   Bordetella Parapertussis NOT DETECTED NOT DETECTED Final   Chlamydophila pneumoniae NOT DETECTED NOT DETECTED Final   Mycoplasma pneumoniae NOT DETECTED NOT DETECTED Final    Comment: Performed at [facility], [address] [place], [place] [address]     Labs: BNP (last 3 results) No results for input(s): BNP in the last 8760 hours. Basic Metabolic Panel: Recent Labs  Lab 09/07/20 1924  NA 136  K 4.0  CL 93*  CO2 32   GLUCOSE 146*  BUN 15  CREATININE 0.91  CALCIUM 8.9   Liver Function Tests: No results for input(s): AST, ALT, ALKPHOS, BILITOT, PROT, ALBUMIN in the last 168 hours. No results for input(s): LIPASE, AMYLASE in the last 168 hours. No results for input(s): AMMONIA in the last 168 hours. CBC: Recent Labs  Lab 09/07/20 1924  WBC 8.9  NEUTROABS 7.6  HGB 13.0  HCT 41.7  MCV 102.7*  PLT 183   Cardiac Enzymes: No results for input(s): CKTOTAL, CKMB, CKMBINDEX, TROPONINI in the last 168 hours. BNP: Invalid input(s): POCBNP CBG: Recent Labs  Lab 09/10/20 1140 09/10/20 1619 09/10/20 2053 09/11/20 0246 09/11/20 0732  GLUCAP 293* 231* 283* 198* 131*   D-Dimer No results for input(s): DDIMER in the last 72 hours. Hgb A1c No results for input(s): HGBA1C in the last 72 hours. Lipid Profile No results for input(s): CHOL, HDL, LDLCALC, TRIG, CHOLHDL, LDLDIRECT in the last 72 hours. Thyroid function studies No results for input(s): TSH, T4TOTAL, T3FREE, THYROIDAB in the last 72 hours.  Invalid input(s): FREET3 Anemia work up No results for input(s): VITAMINB12, FOLATE, FERRITIN, TIBC, IRON, RETICCTPCT in the last 72 hours. Urinalysis    Component Value Date/Time   PROTEINUR neg 05/02/2015 1634   NITRITE neg 05/02/2015 1634   LEUKOCYTESUR Negative 05/02/2015 1634   Sepsis Labs Invalid input(s): PROCALCITONIN,  WBC,  LACTICIDVEN Microbiology Recent Results (from the past 240 hour(s))  Resp Panel by RT-PCR (Flu A&B, Covid) Nasopharyngeal Swab     Status: None   Collection Time: 09/07/20  7:27 PM   Specimen: Nasopharyngeal Swab; Nasopharyngeal(NP) swabs in vial transport medium  Result Value Ref Range Status   SARS Coronavirus 2 by RT PCR NEGATIVE NEGATIVE Final    Comment: (NOTE) SARS-CoV-2 target nucleic acids are NOT DETECTED.  The SARS-CoV-2 RNA is generally detectable in upper respiratory specimens during the acute phase of infection. The lowest concentration of  SARS-CoV-2 viral copies this assay can detect is 138 copies/mL. A negative result does not preclude SARS-Cov-2 infection and should not be used as the sole basis for treatment or other patient management decisions. A negative result may occur with  improper specimen collection/handling, submission of specimen other than nasopharyngeal swab, presence of viral mutation(s) within the areas targeted by this assay, and inadequate number of viral copies(<138 copies/mL). A negative result must be combined with clinical observations, patient history, and epidemiological information. The expected result is Negative.  Fact Sheet for Patients:  BloggerCourse.comhttps://www.fda.gov/media/152166/download  Fact Sheet for Healthcare Providers:  SeriousBroker.ithttps://www.fda.gov/media/152162/download  This test is no t yet approved or cleared by the [place] FDA and  has been authorized for detection and/or diagnosis of SARS-CoV-2 by FDA under an Emergency Use Authorization (EUA).  This EUA will remain  in effect (meaning this test can be used) for the duration of the COVID-19 declaration under Section 564(b)(1) of the Act, 21 U.S.C.section 360bbb-3(b)(1), unless the authorization is terminated  or revoked sooner.       Influenza A by PCR NEGATIVE NEGATIVE Final   Influenza B by PCR NEGATIVE NEGATIVE Final    Comment: (NOTE) The Xpert Xpress SARS-CoV-2/FLU/RSV plus assay is intended as an aid in the diagnosis of influenza from Nasopharyngeal swab specimens and should not be used as a sole basis for treatment. Nasal washings and aspirates are unacceptable for Xpert Xpress SARS-CoV-2/FLU/RSV testing.  Fact Sheet for Patients: EntrepreneurPulse.com.au  Fact Sheet for Healthcare Providers: IncredibleEmployment.be  This test is not yet approved or cleared by the [place] FDA and has been authorized for detection and/or diagnosis of SARS-CoV-2 by FDA under an Emergency Use  Authorization (EUA). This EUA will remain in effect (meaning this test can be used) for the duration of the COVID-19 declaration under Section 564(b)(1) of the Act, 21 U.S.C. section 360bbb-3(b)(1), unless the authorization is terminated or revoked.  Performed at [facility], [address] [place], [place] [address]   SARS CORONAVIRUS 2 (TAT 6-24 HRS) Nasopharyngeal Nasopharyngeal Swab     Status: None   Collection Time: 09/07/20 10:23 PM   Specimen: Nasopharyngeal Swab  Result Value Ref Range Status   SARS Coronavirus 2 NEGATIVE NEGATIVE Final    Comment: (NOTE) SARS-CoV-2 target nucleic acids are NOT DETECTED.  The SARS-CoV-2 RNA is generally detectable in upper and lower respiratory specimens during the acute phase of infection. Negative results do not preclude SARS-CoV-2 infection, do not rule out co-infections with other pathogens, and should not be used as the sole basis for treatment or other patient management decisions. Negative results must be combined with clinical observations, patient history, and epidemiological information. The expected result is Negative.  Fact Sheet for Patients: SugarRoll.be  Fact Sheet for Healthcare Providers: https://www.woods-mathews.com/  This test is not yet approved or cleared by the [place] FDA and  has been authorized for detection and/or diagnosis of SARS-CoV-2 by FDA under an Emergency Use Authorization (EUA). This EUA will remain  in effect (meaning this test can be used) for the duration of the COVID-19 declaration under Se ction 564(b)(1) of the Act, 21 U.S.C. section 360bbb-3(b)(1), unless the authorization is terminated or revoked sooner.  Performed at [facility], [place] [address], [place], [place] [address]   Respiratory (~20 pathogens) panel by PCR     Status: Abnormal   Collection Time: 09/07/20 11:12 PM   Specimen: Nasopharyngeal Swab; Respiratory  Result Value Ref  Range Status   Adenovirus NOT DETECTED NOT DETECTED Final   Coronavirus 229E NOT DETECTED NOT DETECTED Final    Comment: (NOTE) The Coronavirus on the Respiratory Panel, DOES NOT test for the novel  Coronavirus (2019 nCoV)    Coronavirus HKU1 NOT DETECTED NOT DETECTED Final   Coronavirus NL63 NOT DETECTED NOT DETECTED Final   Coronavirus OC43 NOT DETECTED NOT DETECTED Final   Metapneumovirus DETECTED (A) NOT DETECTED Final   Rhinovirus / Enterovirus NOT DETECTED NOT DETECTED Final   Influenza A NOT DETECTED NOT DETECTED Final   Influenza B NOT DETECTED NOT DETECTED Final   Parainfluenza Virus 1 NOT DETECTED NOT DETECTED Final   Parainfluenza Virus 2 NOT DETECTED NOT DETECTED Final   Parainfluenza Virus 3 NOT DETECTED NOT DETECTED Final   Parainfluenza Virus 4 NOT DETECTED NOT DETECTED Final  Respiratory Syncytial Virus NOT DETECTED NOT DETECTED Final   Bordetella pertussis NOT DETECTED NOT DETECTED Final   Bordetella Parapertussis NOT DETECTED NOT DETECTED Final   Chlamydophila pneumoniae NOT DETECTED NOT DETECTED Final   Mycoplasma pneumoniae NOT DETECTED NOT DETECTED Final    Comment: Performed at [facility], [place] [address], [place], [place] [address]   Time coordinating discharge: 40 mins  SIGNED:  Irwin Brakeman, MD  [facility] 09/11/2020, 11:24 AM How to contact the [facility] Attending or Consulting provider [place] or covering provider during after hours [place], for this patient?  1. Check the care team in [facility] and look for a) attending/consulting [facility] provider listed and b) the [facility] team listed 2. Log into [email or web address] and use [place] universal password to access. If you do not have the password, please contact the hospital operator. 3. Locate the [facility] provider you are looking for under [facility] and page to a number that you can be directly reached. 4. If you still have difficulty reaching the provider, please page the [facility] (Director on Call) for the  Hospitalists listed on amion for assistance.

## 2020-09-11 NOTE — Discharge Instructions (Signed)
Please have chest xray done in 3 weeks to follow up.  Your Primary care provider can order for you.    Community-Acquired Pneumonia, Adult Pneumonia is an infection of the lungs. It causes irritation and swelling in the airways of the lungs. Mucus and fluid may also build up inside the airways. This may cause coughing and trouble breathing. One type of pneumonia can happen while you are in a hospital. A different type can happen when you are not in a hospital (community-acquired pneumonia). What are the causes? This condition is caused by germs (viruses, bacteria, or fungi). Some types of germs can spread from person to person. Pneumonia is not thought to spread from person to person.   What increases the risk? You are more likely to develop this condition if:  You have a long-term (chronic) disease, such as: ? Disease of the lungs. This may be chronic obstructive pulmonary disease (COPD) or asthma. ? Heart failure. ? Cystic fibrosis. ? Diabetes. ? Kidney disease. ? Sickle cell disease. ? HIV.  You have other health problems, such as: ? Your body's defense system (immune system) is weak. ? A condition that may cause you to breathe in fluids from your mouth and nose.  You had your spleen taken out.  You do not take good care of your teeth and mouth (poor dental hygiene).  You use or have used tobacco products.  You travel where the germs that cause this illness are common.  You are near certain animals or the places they live.  You are older than 54 years of age. What are the signs or symptoms? Symptoms of this condition include:  A cough.  A fever.  Sweating or chills.  Chest pain, often when you breathe deeply or cough.  Breathing problems, such as: ? Fast breathing. ? Trouble breathing. ? Shortness of breath.  Feeling tired (fatigued).  Muscle aches. How is this treated? Treatment for this condition depends on many things, such as:  The cause of your  illness.  Your medicines.  Your other health problems. Most adults can be treated at home. Sometimes, treatment must happen in a hospital.  Treatment may include medicines to kill germs.  Medicines may depend on which germ caused your illness. Very bad pneumonia is rare. If you get it, you may:  Have a machine to help you breathe.  Have fluid taken away from around your lungs. Follow these instructions at home: Medicines  Take over-the-counter and prescription medicines only as told by your doctor.  Take cough medicine only if you are losing sleep. Cough medicine can keep your body from taking mucus away from your lungs.  If you were prescribed an antibiotic medicine, take it as told by your doctor. Do not stop taking the antibiotic even if you start to feel better. Lifestyle  Do not drink alcohol.  Do not use any products that contain nicotine or tobacco, such as cigarettes, e-cigarettes, and chewing tobacco. If you need help quitting, ask your doctor.  Eat a healthy diet. This includes a lot of vegetables, fruits, whole grains, low-fat dairy products, and low-fat (lean) protein.      General instructions  Rest a lot. Sleep for at least 8 hours each night.  Sleep with your head and neck raised. Put a few pillows under your head or sleep in a reclining chair.  Return to your normal activities as told by your doctor. Ask your doctor what activities are safe for you.  Drink enough fluid  to keep your pee (urine) pale yellow.  If your throat is sore, rinse your mouth often with salt water. To make salt water, dissolve -1 tsp (3-6 g) of salt in 1 cup (237 mL) of warm water.  Keep all follow-up visits as told by your doctor. This is important.   How is this prevented? You can lower your risk of pneumonia by:  Getting the pneumonia shot (vaccine). These shots have different types and schedules. Ask your doctor what works best for you. Think about getting this shot if: ? You  are older than 54 years of age. ? You are 64-32 years of age and:  You are being treated for cancer.  You have long-term lung disease.  You have other problems that affect your body's defense system. Ask your doctor if you have one of these.  Getting your flu shot every year. Ask your doctor which type of shot is best for you.  Going to the dentist as often as told.  Washing your hands often with soap and water for at least 20 seconds. If you cannot use soap and water, use hand sanitizer. Contact a doctor if:  You have a fever.  You lose sleep because your cough medicine does not help. Get help right away if:  You are short of breath and this gets worse.  You have more chest pain.  Your sickness gets worse. This is very serious if: ? You are an older adult. ? Your body's defense system is weak.  You cough up blood. These symptoms may be an emergency. Do not wait to see if the symptoms will go away. Get medical help right away. Call your local emergency services (911 in the [place] Do not drive yourself to the hospital. Summary  Pneumonia is an infection of the lungs.  Community-acquired pneumonia affects people who have not been in the hospital. Certain germs can cause this infection.  This condition may be treated with medicines that kill germs.  For very bad pneumonia, you may need a hospital stay and treatment to help with breathing. This information is not intended to replace advice given to you by your health care provider. Make sure you discuss any questions you have with your health care provider. Document Revised: 06/01/2019 Document Reviewed: 06/01/2019 Elsevier Patient Education  2021 Elsevier Inc.  IMPORTANT INFORMATION: PAY CLOSE ATTENTION   PHYSICIAN DISCHARGE INSTRUCTIONS  Follow with Primary care provider  Kennieth Rad, MD  and other consultants as instructed by your Hospitalist Physician  [place] IF SYMPTOMS COME  BACK, WORSEN OR NEW PROBLEM DEVELOPS   Please note: You were cared for by a hospitalist during your hospital stay. Every effort will be made to forward records to your primary care provider.  You can request that your primary care provider send for your hospital records if they have not received them.  Once you are discharged, your primary care physician will handle any further medical issues. Please note that NO REFILLS for any discharge medications will be authorized once you are discharged, as it is imperative that you return to your primary care physician (or establish a relationship with a primary care physician if you do not have one) for your post hospital discharge needs so that they can reassess your need for medications and monitor your lab values.  Please get a complete blood count and chemistry panel checked by your Primary MD at your next visit, and again as instructed by your  Primary MD.  Get Medicines reviewed and adjusted: Please take all your medications with you for your next visit with your Primary MD  Laboratory/radiological data: Please request your Primary MD to go over all hospital tests and procedure/radiological results at the follow up, please ask your primary care provider to get all Hospital records sent to his/her office.  In some cases, they will be blood work, cultures and biopsy results pending at the time of your discharge. Please request that your primary care provider follow up on these results.  If you are diabetic, please bring your blood sugar readings with you to your follow up appointment with primary care.    Please call and make your follow up appointments as soon as possible.    Also Note the following: If you experience worsening of your admission symptoms, develop shortness of breath, life threatening emergency, suicidal or homicidal thoughts you must seek medical attention immediately by calling 911 or calling your MD immediately  if symptoms less  severe.  You must read complete instructions/literature along with all the possible adverse reactions/side effects for all the Medicines you take and that have been prescribed to you. Take any new Medicines after you have completely understood and accpet all the possible adverse reactions/side effects.   Do not drive when taking Pain medications or sleeping medications (Benzodiazepines)  Do not take more than prescribed Pain, Sleep and Anxiety Medications. It is not advisable to combine anxiety,sleep and pain medications without talking with your primary care practitioner  Special Instructions: If you have smoked or chewed Tobacco  in the last 2 yrs please stop smoking, stop any regular Alcohol  and or any Recreational drug use.  Wear Seat belts while driving.  Do not drive if taking any narcotic, mind altering or controlled substances or recreational drugs or alcohol.

## 2021-07-03 ENCOUNTER — Other Ambulatory Visit: Payer: Self-pay

## 2021-07-03 ENCOUNTER — Emergency Department (HOSPITAL_COMMUNITY): Payer: Medicaid - Out of State

## 2021-07-03 ENCOUNTER — Encounter (HOSPITAL_COMMUNITY): Payer: Self-pay | Admitting: *Deleted

## 2021-07-03 ENCOUNTER — Emergency Department (HOSPITAL_COMMUNITY)
Admission: EM | Admit: 2021-07-03 | Discharge: 2021-07-03 | Disposition: A | Discharge status: Home / Self Care | Payer: Medicaid - Out of State | Attending: Emergency Medicine | Admitting: Emergency Medicine

## 2021-07-03 DIAGNOSIS — Z5321 Procedure and treatment not carried out due to patient leaving prior to being seen by health care provider: Secondary | ICD-10-CM | POA: Diagnosis not present

## 2021-07-03 DIAGNOSIS — J449 Chronic obstructive pulmonary disease, unspecified: Secondary | ICD-10-CM | POA: Diagnosis not present

## 2021-07-03 DIAGNOSIS — R1110 Vomiting, unspecified: Secondary | ICD-10-CM | POA: Diagnosis not present

## 2021-07-03 DIAGNOSIS — R0781 Pleurodynia: Secondary | ICD-10-CM | POA: Diagnosis not present

## 2021-07-03 LAB — CBC
HCT: 35.2 % — ABNORMAL LOW (ref 36.0–46.0)
Hemoglobin: 11.4 g/dL — ABNORMAL LOW (ref 12.0–15.0)
MCH: 33.1 pg (ref 26.0–34.0)
MCHC: 32.4 g/dL (ref 30.0–36.0)
MCV: 102.3 fL — ABNORMAL HIGH (ref 80.0–100.0)
Platelets: 203 10*3/uL (ref 150–400)
RBC: 3.44 MIL/uL — ABNORMAL LOW (ref 3.87–5.11)
RDW: 13.9 % (ref 11.5–15.5)
WBC: 13.4 10*3/uL — ABNORMAL HIGH (ref 4.0–10.5)
nRBC: 0.4 % — ABNORMAL HIGH (ref 0.0–0.2)

## 2021-07-03 LAB — BASIC METABOLIC PANEL
Anion gap: 10 (ref 5–15)
BUN: 12 mg/dL (ref 6–20)
CO2: 30 mmol/L (ref 22–32)
Calcium: 9.4 mg/dL (ref 8.9–10.3)
Chloride: 94 mmol/L — ABNORMAL LOW (ref 98–111)
Creatinine, Ser: 0.72 mg/dL (ref 0.44–1.00)
GFR, Estimated: >60 mL/min (ref >60)
Glucose, Bld: 200 mg/dL — ABNORMAL HIGH (ref 70–99)
Potassium: 4 mmol/L (ref 3.5–5.1)
Sodium: 134 mmol/L — ABNORMAL LOW (ref 135–145)

## 2021-07-03 LAB — TROPONIN I (HIGH SENSITIVITY): Troponin I (High Sensitivity): 9 ng/L (ref <18)

## 2021-07-03 NOTE — ED Notes (Signed)
Signature pad not working during triage, [employer] waiver read and explained to patient, she verbalized understanding of waiver, no further questions.

## 2021-07-03 NOTE — ED Triage Notes (Signed)
Pt reports 4 days of vomiting. She wears 5 liters O2 constant, hx of COPD. Report "lung pain, and pain in the chest". Sates she has "excruciating pain all over when" she tries to walk.

## 2022-05-25 IMAGING — DX DG CHEST 2V
3 series · 3 of 3 positions shown · non-contrast
Comparison: September 07, 2020

CLINICAL DATA: Cough

EXAM:
CHEST - 2 VIEW

[chest lat (1 of 2)]
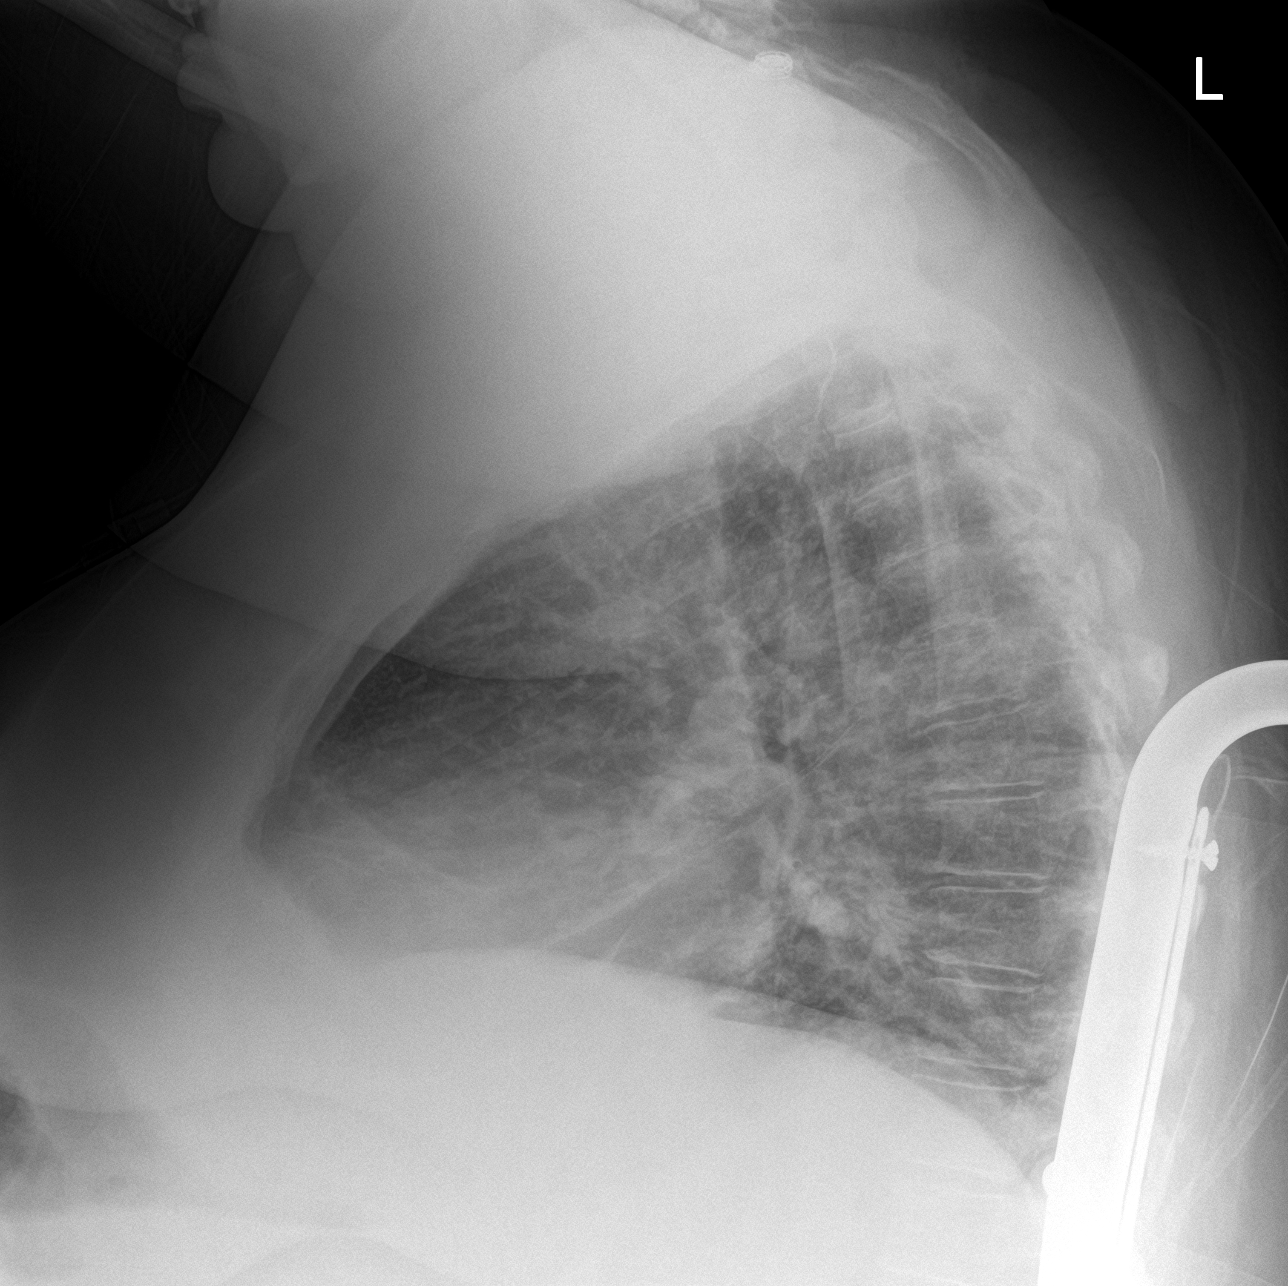

[chest ap]
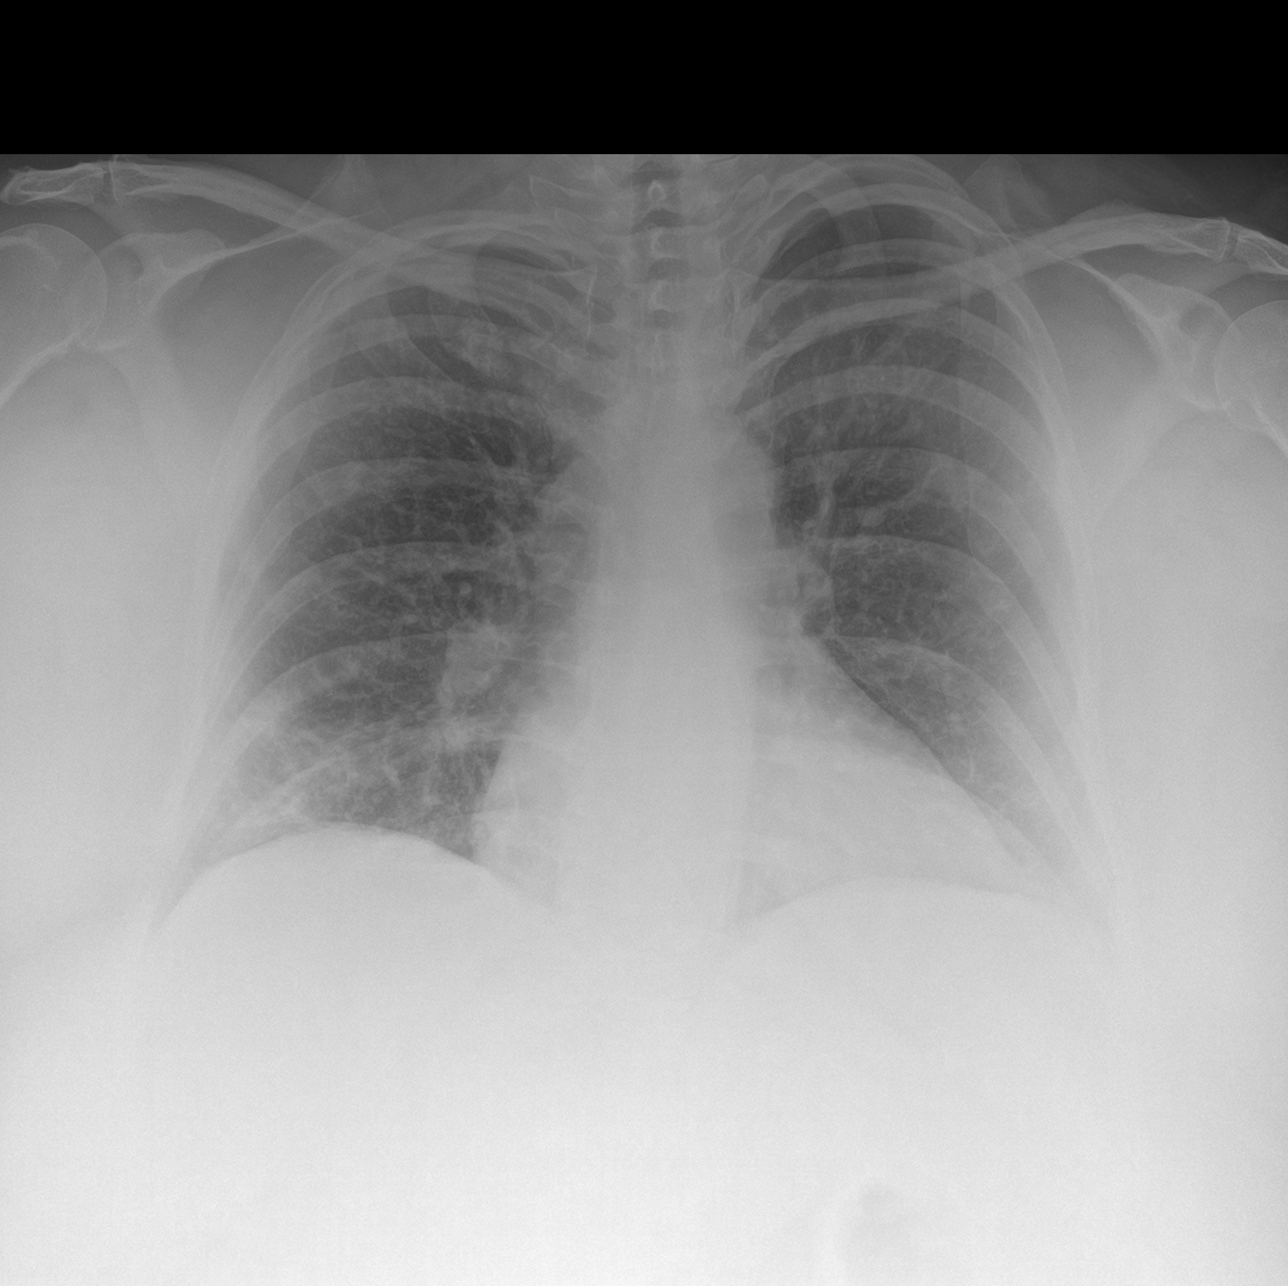

[chest lat (2 of 2)]
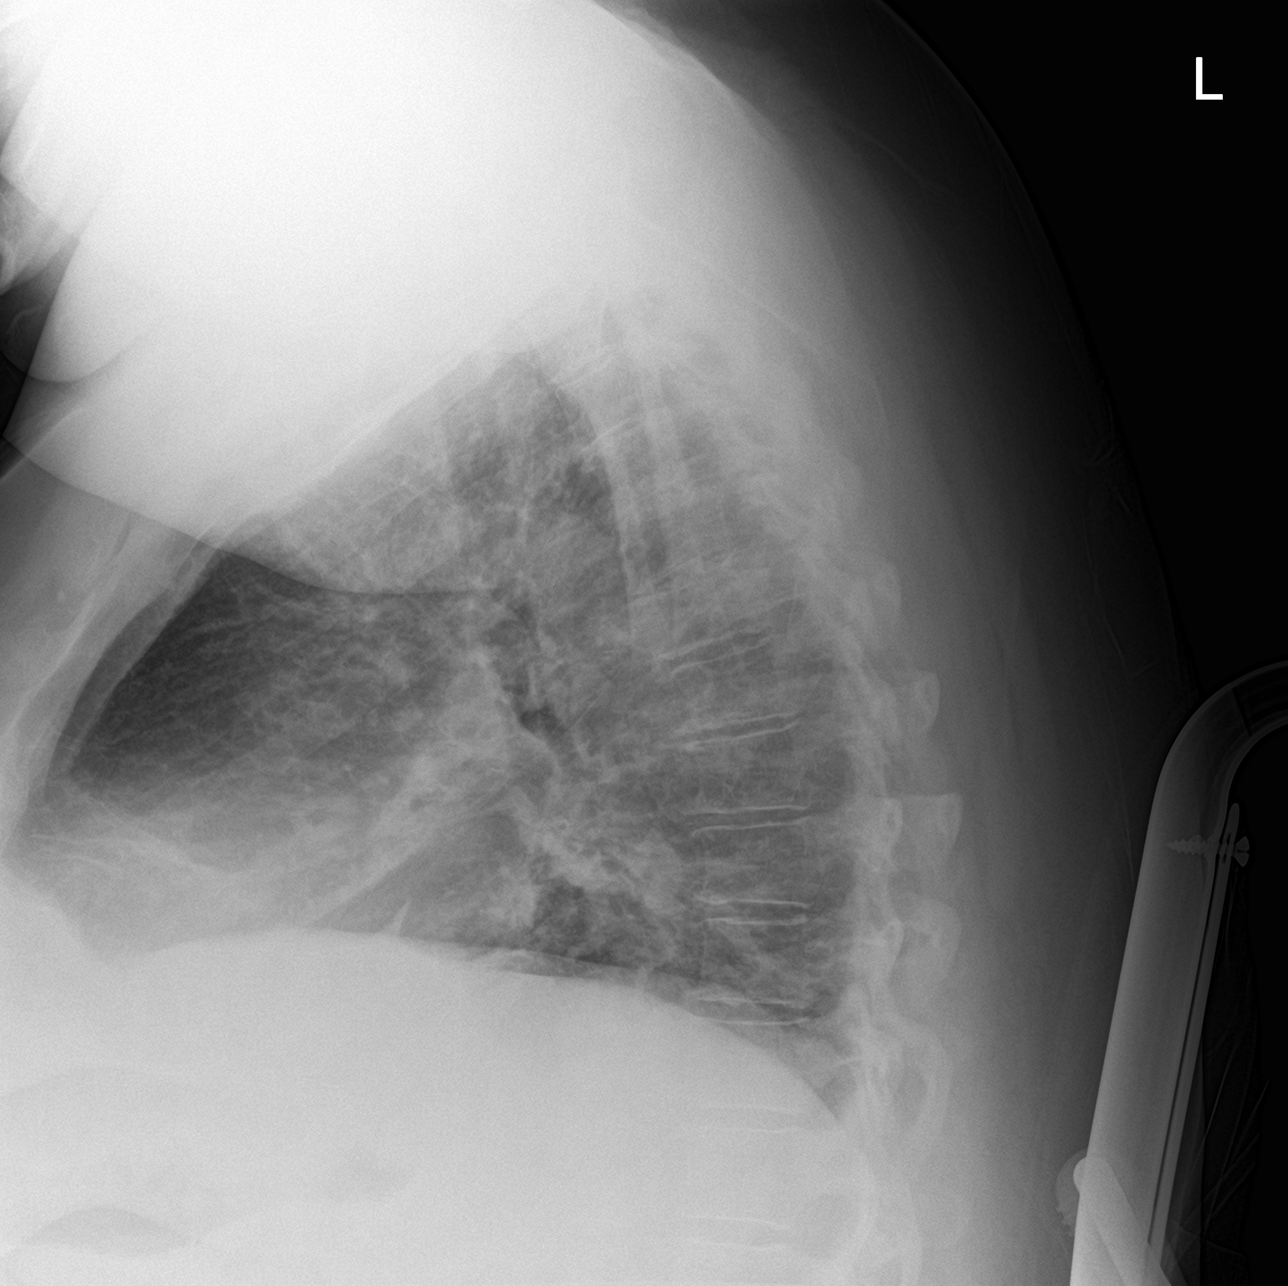

[3 of 3 positions shown; findings below may reference images not displayed]

FINDINGS: The cardiomediastinal silhouette is unchanged in contour.Irregular
nodular contour along the RIGHT hilar border. No pleural effusion.
No pneumothorax. Multifocal reticulonodular opacities, similar to
minimally decreased in comparison to prior. Biapical
pleuroparenchymal thickening. Visualized abdomen is unremarkable.
Mild degenerative changes of the thoracic spine.
IMPRESSION: 1. Multifocal reticulonodular opacities are similar to minimally
decreased in comparison to prior. Differential considerations
include infection (including atypical infection) or edema.
2. There is a more focal irregular nodular opacity along the RIGHT
hilar border. Recommend follow-up PA and lateral radiograph versus
chest CT 3-4 weeks after appropriate treatment to assess for
resolution.

## 2022-06-23 ENCOUNTER — Emergency Department (HOSPITAL_COMMUNITY)
Admission: EM | Admit: 2022-06-23 | Discharge: 2022-06-23 | Disposition: A | Discharge status: Home / Self Care | Payer: Medicaid Other | Attending: Emergency Medicine | Admitting: Emergency Medicine

## 2022-06-23 ENCOUNTER — Other Ambulatory Visit: Payer: Self-pay

## 2022-06-23 ENCOUNTER — Emergency Department (HOSPITAL_COMMUNITY): Payer: Medicaid Other

## 2022-06-23 ENCOUNTER — Encounter (HOSPITAL_COMMUNITY): Payer: Self-pay | Admitting: *Deleted

## 2022-06-23 DIAGNOSIS — M79602 Pain in left arm: Secondary | ICD-10-CM | POA: Insufficient documentation

## 2022-06-23 DIAGNOSIS — I10 Essential (primary) hypertension: Secondary | ICD-10-CM | POA: Insufficient documentation

## 2022-06-23 DIAGNOSIS — D72829 Elevated white blood cell count, unspecified: Secondary | ICD-10-CM | POA: Insufficient documentation

## 2022-06-23 DIAGNOSIS — Z79899 Other long term (current) drug therapy: Secondary | ICD-10-CM | POA: Diagnosis not present

## 2022-06-23 DIAGNOSIS — Z7984 Long term (current) use of oral hypoglycemic drugs: Secondary | ICD-10-CM | POA: Insufficient documentation

## 2022-06-23 DIAGNOSIS — Z7982 Long term (current) use of aspirin: Secondary | ICD-10-CM | POA: Insufficient documentation

## 2022-06-23 DIAGNOSIS — J449 Chronic obstructive pulmonary disease, unspecified: Secondary | ICD-10-CM | POA: Insufficient documentation

## 2022-06-23 DIAGNOSIS — M5410 Radiculopathy, site unspecified: Secondary | ICD-10-CM | POA: Diagnosis not present

## 2022-06-23 DIAGNOSIS — E119 Type 2 diabetes mellitus without complications: Secondary | ICD-10-CM | POA: Diagnosis not present

## 2022-06-23 DIAGNOSIS — M25512 Pain in left shoulder: Secondary | ICD-10-CM | POA: Diagnosis present

## 2022-06-23 HISTORY — DX: Other specified disorders of bone, unspecified site: M89.8X9

## 2022-06-23 HISTORY — DX: Cr(e)st syndrome: M34.1

## 2022-06-23 HISTORY — DX: Dependence on supplemental oxygen: Z99.81

## 2022-06-23 HISTORY — DX: Polyneuropathy, unspecified: G62.9

## 2022-06-23 LAB — CBC
HCT: 33.9 % — ABNORMAL LOW (ref 36.0–46.0)
Hemoglobin: 10.5 g/dL — ABNORMAL LOW (ref 12.0–15.0)
MCH: 31.9 pg (ref 26.0–34.0)
MCHC: 31 g/dL (ref 30.0–36.0)
MCV: 103 fL — ABNORMAL HIGH (ref 80.0–100.0)
Platelets: 268 10*3/uL (ref 150–400)
RBC: 3.29 MIL/uL — ABNORMAL LOW (ref 3.87–5.11)
RDW: 13.5 % (ref 11.5–15.5)
WBC: 11.3 10*3/uL — ABNORMAL HIGH (ref 4.0–10.5)
nRBC: 0 % (ref 0.0–0.2)

## 2022-06-23 LAB — TROPONIN I (HIGH SENSITIVITY)
Troponin I (High Sensitivity): 11 ng/L (ref <18)
Troponin I (High Sensitivity): 11 ng/L (ref <18)

## 2022-06-23 LAB — BASIC METABOLIC PANEL
Anion gap: 9 (ref 5–15)
BUN: 25 mg/dL — ABNORMAL HIGH (ref 6–20)
CO2: 37 mmol/L — ABNORMAL HIGH (ref 22–32)
Calcium: 8.9 mg/dL (ref 8.9–10.3)
Chloride: 95 mmol/L — ABNORMAL LOW (ref 98–111)
Creatinine, Ser: 0.83 mg/dL (ref 0.44–1.00)
GFR, Estimated: >60 mL/min (ref >60)
Glucose, Bld: 189 mg/dL — ABNORMAL HIGH (ref 70–99)
Potassium: 4 mmol/L (ref 3.5–5.1)
Sodium: 141 mmol/L (ref 135–145)

## 2022-06-23 MED ORDER — GABAPENTIN 100 MG PO CAPS: 100.0000 mg | ORAL_CAPSULE | Freq: Three times a day (TID) | ORAL | 0 refills | Status: DC

## 2022-06-23 MED ORDER — OXYCODONE-ACETAMINOPHEN 5-325 MG PO TABS: 1.0000 | ORAL_TABLET | Freq: Four times a day (QID) | ORAL | 0 refills | Status: DC | PRN

## 2022-06-23 MED ORDER — DIAZEPAM 5 MG/ML IJ SOLN
5.0000 mg | Freq: Once | INTRAMUSCULAR | Status: AC
  Administered 2022-06-23: 5 mg via INTRAVENOUS
  Filled 2022-06-23: qty 2

## 2022-06-23 MED ORDER — MORPHINE SULFATE (PF) 4 MG/ML IV SOLN
6.0000 mg | Freq: Once | INTRAVENOUS | Status: AC
  Administered 2022-06-23: 6 mg via INTRAVENOUS
  Filled 2022-06-23: qty 2

## 2022-06-23 NOTE — Discharge Instructions (Addendum)
You have been evaluated for your shoulder pain.  Fortunately no evidence of heart injury, no broken bone, and no other concerning finding noted.  Your symptoms likely due to a pinched nerve.  Take gabapentin as prescribed.  Follow-up closely with your doctor for further care.  Return if any concern.

## 2022-06-23 NOTE — ED Provider Notes (Signed)
[facility] EMERGENCY DEPARTMENT Provider Note   CSN: [id number] Arrival date & time: 06/23/22  1856     History  Chief Complaint  Patient presents with   Shoulder Pain    Jennifer Odonnell is a 56 y.o. female.  The history is provided by the patient and medical records. No language interpreter was used.  Shoulder Pain    56 year old female significant history of hypertension, diabetes, COPD currently on 8 L of supplemental oxygen at baseline and history of crest syndrome presenting with complaints of shoulder pain.  Patient report for the past 4 days she has had persistent pain throughout her left shoulder and arm.  She described pain as a sharp shooting sensation, intense, unrelieved despite using over-the-counter medications such as Tylenol, ibuprofen, IcyHot, and cream.  Pain is not associate with fever or chills no chest pain no increased shortness of breath no productive cough no neck pain no numbness or weakness.  She denies any trauma.  She is right-hand dominant.  She has never had this problem before.  Home Medications Prior to Admission medications   Medication Sig Start Date End Date Taking? Authorizing Provider  albuterol (PROVENTIL HFA;VENTOLIN HFA) 108 (90 BASE) MCG/ACT inhaler Inhale 2 puffs into the lungs every 6 (six) hours as needed for wheezing or shortness of breath.    [provider]  albuterol (PROVENTIL) (2.5 MG/3ML) 0.083% nebulizer solution Take 3 mLs (2.5 mg total) by nebulization every 4 (four) hours as needed for wheezing or shortness of breath. 09/11/20   Johnson, [name], MD  ascorbic acid (VITAMIN C) 500 MG tablet Take 500 mg by mouth daily.    [provider]  aspirin 81 MG EC tablet Take 81 mg by mouth daily.    [provider]  atenolol (TENORMIN) 25 MG tablet Take 25 mg by mouth daily. 08/14/20   [provider]  budesonide (PULMICORT) 0.5 MG/2ML nebulizer solution Take 0.5 mg by nebulization 2 (two) times daily.  08/14/20   [provider]  carisoprodol (SOMA) 350 MG tablet Take 350 mg by mouth 3 (three) times daily. 08/04/20   [provider]  colchicine 0.6 MG tablet Take 0.6 mg by mouth daily. 09/04/20   [provider]  diclofenac Sodium (VOLTAREN) 1 % GEL Apply 1 g topically 4 (four) times daily. 08/14/20   [provider]  docusate sodium (COLACE) 100 MG capsule Take 100 mg by mouth 2 (two) times daily.    [provider]  fenofibrate (TRICOR) 48 MG tablet Take 48 mg by mouth daily. 08/14/20   [provider]  FEROSUL 325 (65 Fe) MG tablet Take 325 mg by mouth daily. 08/15/20   [provider]  folic acid (FOLVITE) 1 MG tablet Take 1 mg by mouth daily. 08/14/20   [provider]  gabapentin (NEURONTIN) 600 MG tablet Take 600 mg by mouth 3 (three) times daily. 09/01/20   [provider]  glipiZIDE (GLUCOTROL XL) 2.5 MG 24 hr tablet Take 2.5 mg by mouth every morning. 08/14/20   [provider]  guaiFENesin-codeine 100-10 MG/5ML syrup Take 10 mLs by mouth every 6 (six) hours as needed for cough. 09/11/20   Johnson, [name], MD  hydroxychloroquine (PLAQUENIL) 200 MG tablet Take 400 mg by mouth daily. 08/14/20   [provider]  ipratropium-albuterol (DUONEB) 0.5-2.5 (3) MG/3ML SOLN Take 3 mLs by nebulization 3 (three) times daily. 08/14/20   [provider]  LANTUS SOLOSTAR 100 UNIT/ML Solostar Pen Inject 44 Units  into the skin 2 (two) times daily. 09/04/20   [provider]  losartan-hydrochlorothiazide (HYZAAR) 100-12.5 MG tablet Take 1 tablet by mouth daily. 08/14/20   [provider]  metFORMIN (GLUCOPHAGE) 1000 MG tablet Take 1,000 mg by mouth 2 (two) times daily with a meal.    [provider]  Multiple Vitamins-Calcium (ONE-A-DAY WOMENS FORMULA) TABS Take 1 tablet by mouth daily.    [provider]  nystatin ointment (MYCOSTATIN) Apply 1 application topically  2 (two) times daily.    [provider]  omeprazole (PRILOSEC) 40 MG capsule Take 40 mg by mouth daily. 08/15/20   [provider]  Oxycodone HCl 10 MG TABS Take 10 mg by mouth 4 (four) times daily. 08/25/20   [provider]  predniSONE (DELTASONE) 20 MG tablet Take 3 PO QAM x5days, 2 PO QAM x5days, 1 PO QAM x5days, then resume 10 mg QD 09/12/20   Johnson, [name], MD  PREMARIN 0.625 MG tablet Take 0.625 mg by mouth daily. 08/14/20   [provider]  promethazine (PHENERGAN) 25 MG tablet Take 25 mg by mouth every 12 (twelve) hours.    [provider]  simvastatin (ZOCOR) 40 MG tablet Take 40 mg by mouth every evening. 08/14/20   [provider]  sitaGLIPtin (JANUVIA) 100 MG tablet Take 100 mg by mouth daily.    [provider]  TRULICITY 4.00 QQ/7.6PP SOPN Inject 0.75 mg into the skin once a week. 09/04/20   [provider]  Vitamin D, Ergocalciferol, (DRISDOL) 1.25 MG (50000 UNIT) CAPS capsule Take 50,000 Units by mouth once a week. 08/14/20   [provider]      Allergies    Daucus carota, Lisinopril, Tiotropium, Erythromycin, Ambien [zolpidem], and Spiriva handihaler [tiotropium bromide monohydrate]    Review of Systems   Review of Systems  All other systems reviewed and are negative.   Physical Exam Updated Vital Signs BP 123/72   Pulse 78   Temp 98.3 F (36.8 C) (Oral)   Resp 18   Ht '5\' 6"'$  (1.676 m)   Wt (!) 145.2 kg   SpO2 99%   BMI 51.65 kg/m  Physical Exam Vitals and nursing note reviewed.  Constitutional:      General: She is not in acute distress.    Appearance: She is well-developed. She is obese.     Comments: Morbidly obese female on supplemental oxygen appears uncomfortable.  HENT:     Head: Atraumatic.  Eyes:     Conjunctiva/sclera: Conjunctivae normal.  Neck:     Comments: No midline cervical spine tenderness. Cardiovascular:     Rate and Rhythm: Normal rate and regular  rhythm.     Pulses: Normal pulses.     Heart sounds: Normal heart sounds.  Pulmonary:     Effort: Pulmonary effort is normal.     Breath sounds: No wheezing, rhonchi or rales.  Abdominal:     Palpations: Abdomen is soft.     Tenderness: There is no abdominal tenderness.  Musculoskeletal:        General: Tenderness (Diffuse tenderness to palpation of left arm from shoulder to hand without focal point tenderness no overlying skin changes no restriction of shoulder or elbow movement.  Radial pulse 2+ normal grip strength) present.     Cervical back: Neck supple.  Skin:    Capillary Refill: Capillary refill takes less than 2 seconds.     Findings: No rash.  Neurological:     Mental Status: She  is alert. Mental status is at baseline.  Psychiatric:        Mood and Affect: Mood normal.     ED Results / Procedures / Treatments   Labs (all labs ordered are listed, but only abnormal results are displayed) Labs Reviewed  BASIC METABOLIC PANEL - Abnormal; Notable for the following components:      Result Value   Chloride 95 (*)    CO2 37 (*)    Glucose, Bld 189 (*)    BUN 25 (*)    All other components within normal limits  CBC - Abnormal; Notable for the following components:   WBC 11.3 (*)    RBC 3.29 (*)    Hemoglobin 10.5 (*)    HCT 33.9 (*)    MCV 103.0 (*)    All other components within normal limits  TROPONIN I (HIGH SENSITIVITY)  TROPONIN I (HIGH SENSITIVITY)    EKG EKG Interpretation  Date/Time:  Sunday June 23 2022 09:20:32 EDT Ventricular Rate:  80 PR Interval:  174 QRS Duration: 83 QT Interval:  346 QTC Calculation: 400 R Axis:   32 Text Interpretation: Sinus rhythm Confirmed by Fredia Sorrow [phone]) on 06/23/2022 10:48:06 AM  Radiology DG Chest Port 1 View  Result Date: 06/23/2022 CLINICAL DATA:  Left arm pain EXAM: PORTABLE CHEST 1 VIEW COMPARISON:  07/03/2021 FINDINGS: Low volume chest with cardiomegaly. There is no edema, consolidation, effusion,  or pneumothorax. Stable aortic and hilar contours. IMPRESSION: 1. No acute finding. 2. Chronic cardiomegaly. Electronically Signed   By: Jorje Guild M.D.   On: 06/23/2022 10:06    Procedures Procedures    Medications Ordered in ED Medications  diazepam (VALIUM) injection 5 mg (5 mg Intravenous Given 06/23/22 1010)  morphine (PF) 4 MG/ML injection 6 mg (6 mg Intravenous Given 06/23/22 1145)    ED Course/ Medical Decision Making/ A&P                           Medical Decision Making Amount and/or Complexity of Data Reviewed Labs: ordered. Radiology: ordered.  Risk Prescription drug management.   BP (!) 178/89 (BP Location: Right Arm)   Pulse 79   Temp 98.3 F (36.8 C) (Oral)   Resp 13   Ht '5\' 6"'$  (1.676 m)   Wt (!) 145.2 kg   SpO2 100%   BMI 51.65 kg/m   91:64 AM 56 year old female significant history of hypertension, diabetes, COPD currently on 8 L of supplemental oxygen at baseline and history of crest syndrome presenting with complaints of shoulder pain.  Patient report for the past 4 days she has had persistent pain throughout her left shoulder and arm.  She described pain as a sharp shooting sensation, intense, unrelieved despite using over-the-counter medications such as Tylenol, ibuprofen, IcyHot, and cream.  Pain is not associate with fever or chills no chest pain no increased shortness of breath no productive cough no neck pain no numbness or weakness.  She denies any trauma.  She is right-hand dominant.  She has never had this problem before.  On exam this is a morbidly obese female sitting in bed wearing supplemental oxygen appears uncomfortable.  Occasionally she will grab her left arm and appears to be in pain.  She does not have any decreased range of motion about her neck.  No cervical midline spine tenderness.  She has diffuse tenderness throughout her left arm on gentle palpation but no focal point tenderness no deformity no  overlying skin changes and her radial  pulses 2+.  She has normal grip strength.  Arm compartment is soft.  Heart and lung sounds normal.  I suspect her arm pain is likely neuropathic in origin.  I have low suspicion for fracture dislocation of her shoulder elbow or forearm.  I have considered cervical radiculopathy but she does not have any pain with neck range of motion.  Given her comorbidity, I will also screen for potential cardiac source with chest x-ray EKG troponin and labs however my suspicion is low.  I have also considered dissection as a cause but felt less likely.  Patient given IV lorazepam for symptom control.  11:38 AM Labs, EKG, and imaging obtained independently viewed and interpreted by me and I agree with radiologist interpretation.  Initial troponin within normal limit.  Electrolyte panels are reassuring.  Mildly elevated white count of 11.3, nonspecific.  Hemoglobin is 10.5 similar to baseline.  Chest x-ray without any acute finding.  EKG shows normal sinus rhythm.  Patient request for additional pain management.  IV morphine given.  1:12 PM Negative delta troponin, doubt ACS.  Patient is neurovascular intact doubt vascular problem.  She did endorse some improvement of pain after receiving morphine.  I felt her symptoms likely a pinched nerve.  We will discharge home with gabapentin and encouraged patient to follow-up closely with PCP for outpatient management.  Return precaution given.  I have considered steroid but in the setting of history of diabetes, I felt it could cause more harm.        Final Clinical Impression(s) / ED Diagnoses Final diagnoses:  Radiculopathy, unspecified spinal region  Left arm pain    Rx / DC Orders ED Discharge Orders          Ordered    gabapentin (NEURONTIN) 100 MG capsule  3 times daily        06/23/22 1314              Domenic Moras, PA-C 06/23/22 1315    Fredia Sorrow, MD 06/23/22 1515

## 2022-06-23 NOTE — ED Triage Notes (Signed)
Pt c/o worsening left shoulder blade pain that radiates all the way down left arm x 4 days. Denies injury. Denies chest pain. Denies SOB. Pt reports she wears 8L O2 via [place] at all times at home. RT called and pt switched over to humidified oxygen at 8L.

## 2022-06-23 NOTE — ED Notes (Signed)
Pt d/c home per MD order, discharge summary reviewed with pt, pt verbalizes understanding. Off unit via WC- No s/s of acute distress noted at discharge. Discharged home with spouse, who Is discharge ride home.

## 2023-01-28 ENCOUNTER — Encounter (HOSPITAL_COMMUNITY): Payer: Self-pay | Admitting: Emergency Medicine

## 2023-01-28 ENCOUNTER — Emergency Department (HOSPITAL_COMMUNITY)
Admission: EM | Admit: 2023-01-28 | Discharge: 2023-01-28 | Discharge status: Against Medical Advice | Payer: PRIVATE HEALTH INSURANCE | Attending: Emergency Medicine | Admitting: Emergency Medicine

## 2023-01-28 ENCOUNTER — Emergency Department (HOSPITAL_COMMUNITY): Payer: PRIVATE HEALTH INSURANCE

## 2023-01-28 ENCOUNTER — Other Ambulatory Visit: Payer: Self-pay

## 2023-01-28 DIAGNOSIS — M79602 Pain in left arm: Secondary | ICD-10-CM | POA: Diagnosis present

## 2023-01-28 DIAGNOSIS — Z5321 Procedure and treatment not carried out due to patient leaving prior to being seen by health care provider: Secondary | ICD-10-CM | POA: Diagnosis not present

## 2023-01-28 DIAGNOSIS — E119 Type 2 diabetes mellitus without complications: Secondary | ICD-10-CM | POA: Insufficient documentation

## 2023-01-28 DIAGNOSIS — M79601 Pain in right arm: Secondary | ICD-10-CM | POA: Insufficient documentation

## 2023-01-28 DIAGNOSIS — I10 Essential (primary) hypertension: Secondary | ICD-10-CM | POA: Diagnosis not present

## 2023-01-28 DIAGNOSIS — J449 Chronic obstructive pulmonary disease, unspecified: Secondary | ICD-10-CM | POA: Diagnosis not present

## 2023-01-28 LAB — CBG MONITORING, ED: Glucose-Capillary: 314 mg/dL — ABNORMAL HIGH (ref 70–99)

## 2023-01-28 NOTE — ED Triage Notes (Signed)
Pt c/o fall after her legs gave out while she was headed to the bathroom around 4am this morning. She hit her dresser, nightstand, and table on the way down. Pt reports pain to her entire body, worst under her arms and posterior head. Pt takes aspirin. Pt has 6L baseline O2 requirement, currently on 10L in triage. O2 sat 98% on 10L. Hx DM2, HTN, HLD, RLS, Crest syndrome, COPD. CBG 351 in triage

## 2023-03-17 IMAGING — DX DG CHEST 2V
2 series · 2 of 2 positions shown · non-contrast
Comparison: 09/10/2020.

CLINICAL DATA: Pt c/o SOB, CP, nausea, vomiting, headache, lack of
appetite x4 days. Hx of COPD, T2DM, HTN. Former smoker, current vape
user.

EXAM:
CHEST - 2 VIEW

[chest lat]
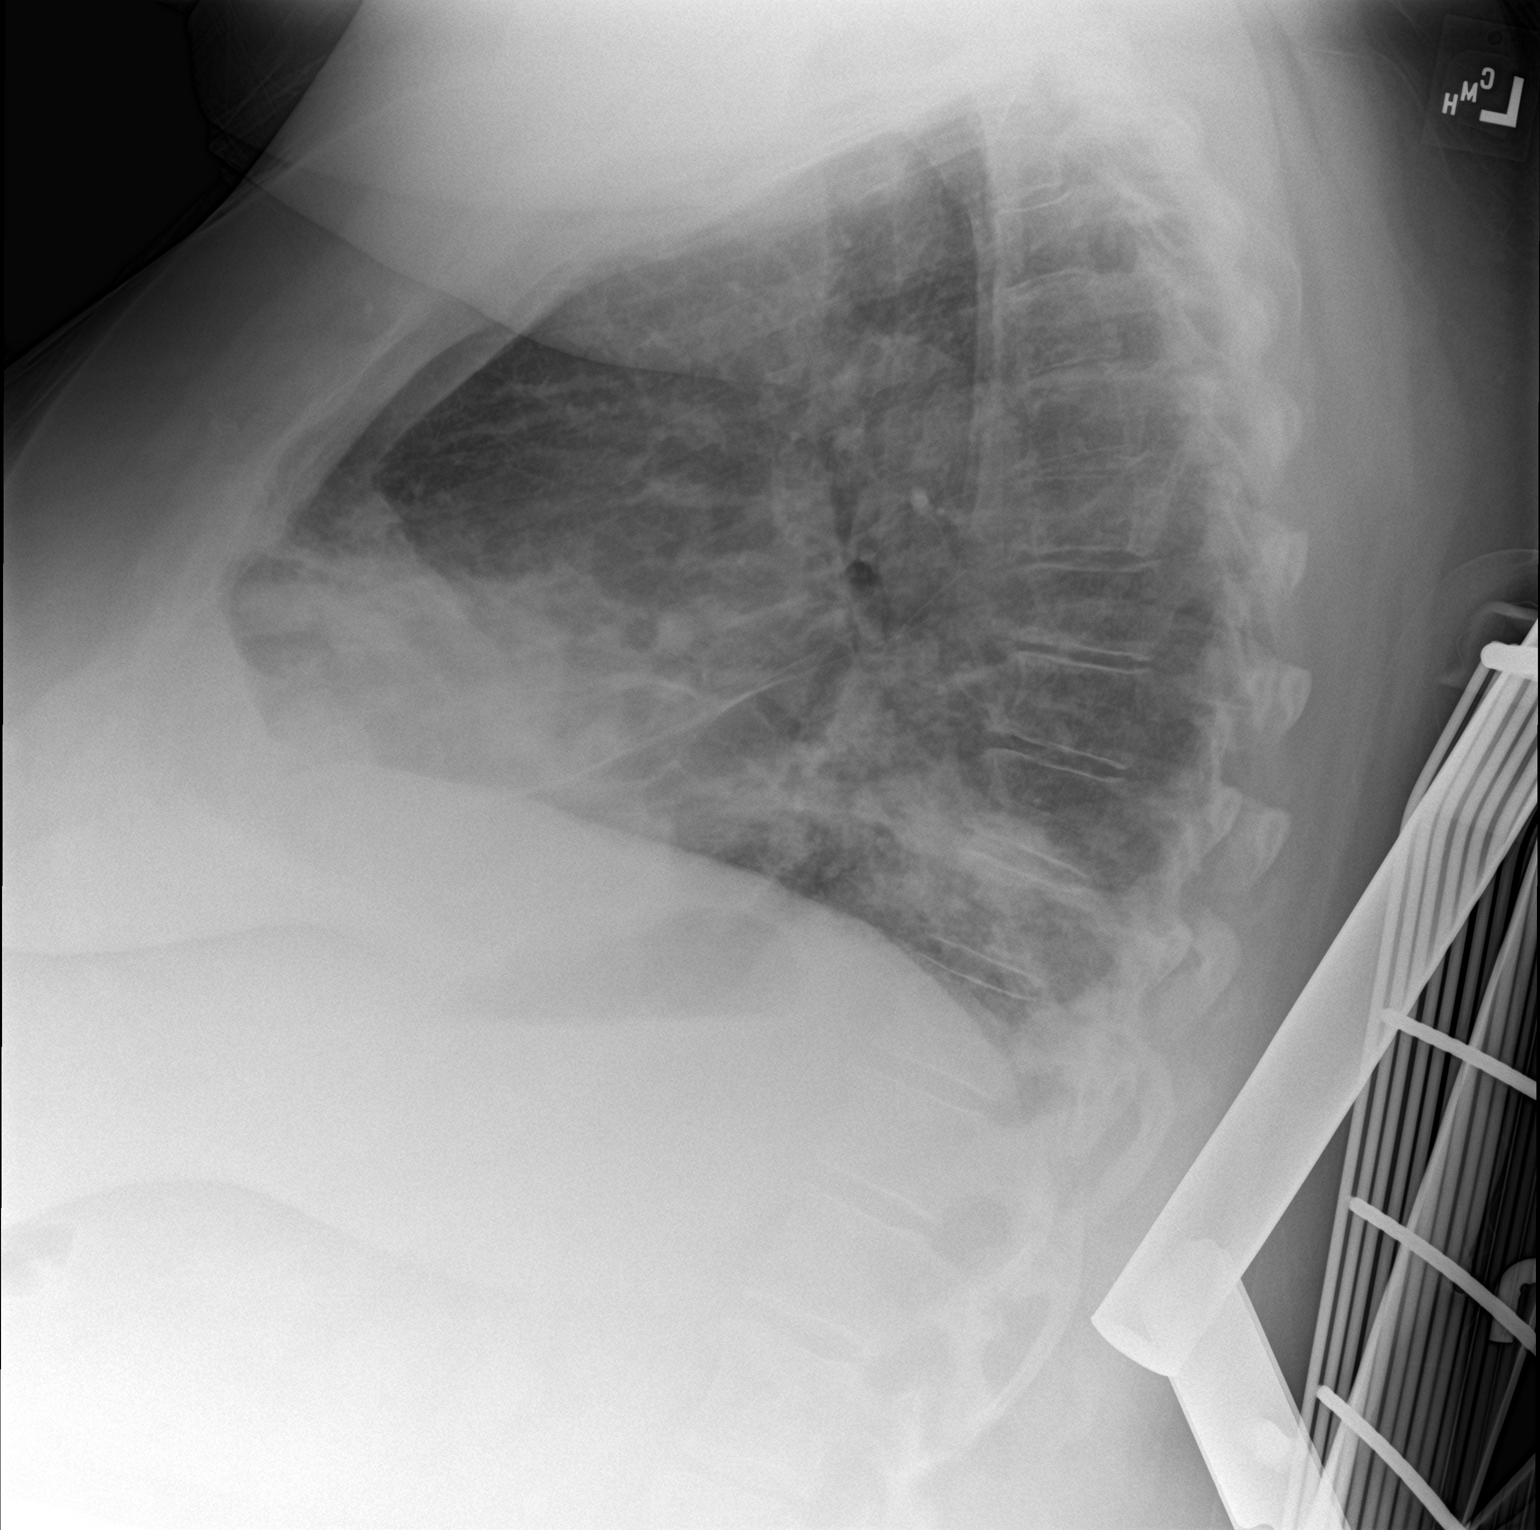

[chest ap]
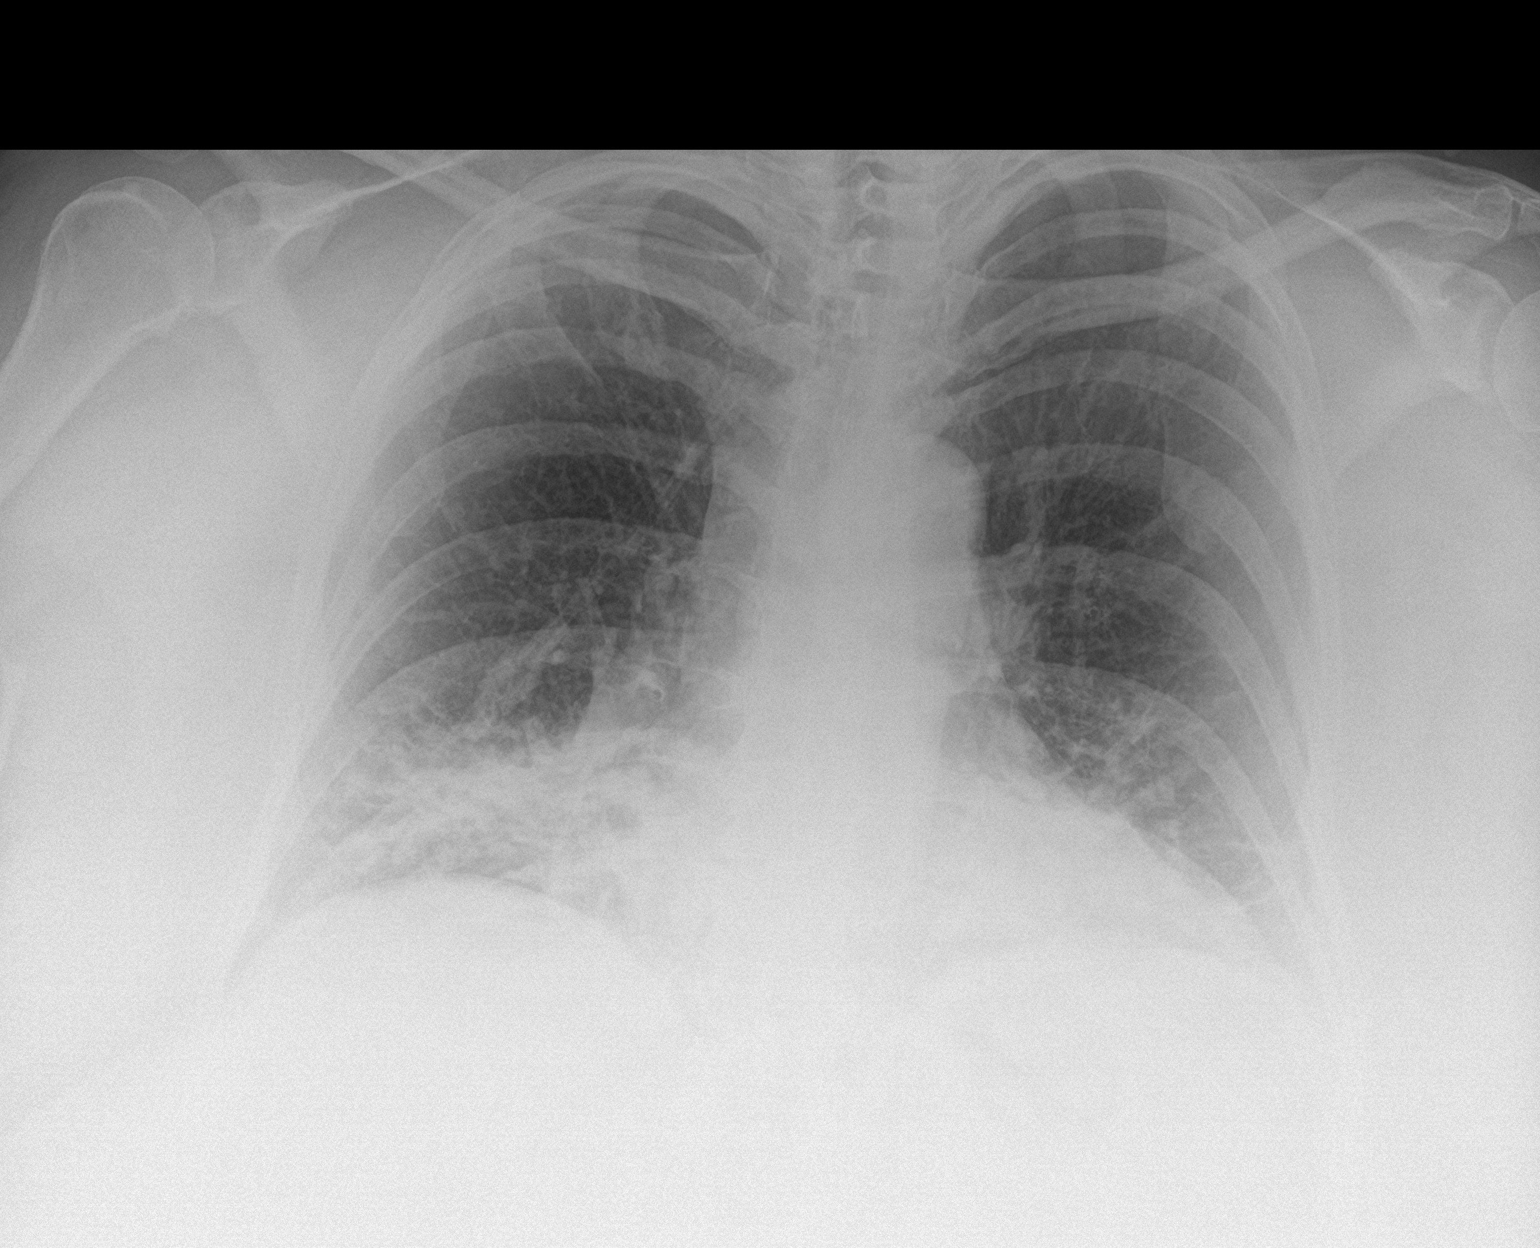

[2 of 2 positions shown; findings below may reference images not displayed]

FINDINGS: Airspace consolidation is noted at the lung bases, most evident
right. Mid and upper lungs are clear.

Heart is normal in size. No mediastinal or hilar masses or evidence
of adenopathy.

No pleural effusion or pneumothorax.

Skeletal structures are intact.
IMPRESSION: 1. Bilateral, right greater than left, lung base airspace opacities
consistent with multifocal pneumonia.

## 2023-06-03 ENCOUNTER — Emergency Department (HOSPITAL_COMMUNITY): Payer: Medicaid Other

## 2023-06-03 ENCOUNTER — Other Ambulatory Visit: Payer: Self-pay

## 2023-06-03 ENCOUNTER — Inpatient Hospital Stay (HOSPITAL_COMMUNITY)
Admission: EM | Admit: 2023-06-03 | Discharge: 2023-06-05 | DRG: 682 | Discharge status: Against Medical Advice | Payer: Medicaid Other | Attending: Family Medicine | Admitting: Family Medicine

## 2023-06-03 ENCOUNTER — Encounter (HOSPITAL_COMMUNITY): Payer: Self-pay | Admitting: Emergency Medicine

## 2023-06-03 DIAGNOSIS — Z833 Family history of diabetes mellitus: Secondary | ICD-10-CM

## 2023-06-03 DIAGNOSIS — W1839XA Other fall on same level, initial encounter: Secondary | ICD-10-CM | POA: Diagnosis present

## 2023-06-03 DIAGNOSIS — J9621 Acute and chronic respiratory failure with hypoxia: Secondary | ICD-10-CM | POA: Diagnosis present

## 2023-06-03 DIAGNOSIS — Y92009 Unspecified place in unspecified non-institutional (private) residence as the place of occurrence of the external cause: Secondary | ICD-10-CM

## 2023-06-03 DIAGNOSIS — E1140 Type 2 diabetes mellitus with diabetic neuropathy, unspecified: Secondary | ICD-10-CM | POA: Diagnosis present

## 2023-06-03 DIAGNOSIS — E662 Morbid (severe) obesity with alveolar hypoventilation: Secondary | ICD-10-CM | POA: Diagnosis present

## 2023-06-03 DIAGNOSIS — Z79891 Long term (current) use of opiate analgesic: Secondary | ICD-10-CM

## 2023-06-03 DIAGNOSIS — R0609 Other forms of dyspnea: Principal | ICD-10-CM

## 2023-06-03 DIAGNOSIS — Z91018 Allergy to other foods: Secondary | ICD-10-CM

## 2023-06-03 DIAGNOSIS — Y9301 Activity, walking, marching and hiking: Secondary | ICD-10-CM | POA: Diagnosis present

## 2023-06-03 DIAGNOSIS — I110 Hypertensive heart disease with heart failure: Secondary | ICD-10-CM | POA: Diagnosis present

## 2023-06-03 DIAGNOSIS — Z91199 Patient's noncompliance with other medical treatment and regimen due to unspecified reason: Secondary | ICD-10-CM

## 2023-06-03 DIAGNOSIS — Z794 Long term (current) use of insulin: Secondary | ICD-10-CM

## 2023-06-03 DIAGNOSIS — Z1152 Encounter for screening for COVID-19: Secondary | ICD-10-CM

## 2023-06-03 DIAGNOSIS — Z79890 Hormone replacement therapy (postmenopausal): Secondary | ICD-10-CM

## 2023-06-03 DIAGNOSIS — M341 CR(E)ST syndrome: Secondary | ICD-10-CM | POA: Diagnosis present

## 2023-06-03 DIAGNOSIS — Z79899 Other long term (current) drug therapy: Secondary | ICD-10-CM

## 2023-06-03 DIAGNOSIS — J441 Chronic obstructive pulmonary disease with (acute) exacerbation: Secondary | ICD-10-CM | POA: Diagnosis present

## 2023-06-03 DIAGNOSIS — Z825 Family history of asthma and other chronic lower respiratory diseases: Secondary | ICD-10-CM

## 2023-06-03 DIAGNOSIS — E119 Type 2 diabetes mellitus without complications: Secondary | ICD-10-CM

## 2023-06-03 DIAGNOSIS — Z8541 Personal history of malignant neoplasm of cervix uteri: Secondary | ICD-10-CM

## 2023-06-03 DIAGNOSIS — Z8249 Family history of ischemic heart disease and other diseases of the circulatory system: Secondary | ICD-10-CM

## 2023-06-03 DIAGNOSIS — Z7984 Long term (current) use of oral hypoglycemic drugs: Secondary | ICD-10-CM

## 2023-06-03 DIAGNOSIS — Z7952 Long term (current) use of systemic steroids: Secondary | ICD-10-CM

## 2023-06-03 DIAGNOSIS — Z7951 Long term (current) use of inhaled steroids: Secondary | ICD-10-CM

## 2023-06-03 DIAGNOSIS — Z90711 Acquired absence of uterus with remaining cervical stump: Secondary | ICD-10-CM

## 2023-06-03 DIAGNOSIS — Z7982 Long term (current) use of aspirin: Secondary | ICD-10-CM

## 2023-06-03 DIAGNOSIS — G8929 Other chronic pain: Secondary | ICD-10-CM | POA: Diagnosis present

## 2023-06-03 DIAGNOSIS — Z5329 Procedure and treatment not carried out because of patient's decision for other reasons: Secondary | ICD-10-CM | POA: Diagnosis not present

## 2023-06-03 DIAGNOSIS — E1122 Type 2 diabetes mellitus with diabetic chronic kidney disease: Secondary | ICD-10-CM | POA: Diagnosis present

## 2023-06-03 DIAGNOSIS — Z87891 Personal history of nicotine dependence: Secondary | ICD-10-CM

## 2023-06-03 DIAGNOSIS — Z6841 Body Mass Index (BMI) 40.0 and over, adult: Secondary | ICD-10-CM

## 2023-06-03 DIAGNOSIS — I10 Essential (primary) hypertension: Secondary | ICD-10-CM | POA: Diagnosis present

## 2023-06-03 DIAGNOSIS — Z881 Allergy status to other antibiotic agents status: Secondary | ICD-10-CM

## 2023-06-03 DIAGNOSIS — I509 Heart failure, unspecified: Secondary | ICD-10-CM | POA: Diagnosis present

## 2023-06-03 DIAGNOSIS — Z8261 Family history of arthritis: Secondary | ICD-10-CM

## 2023-06-03 DIAGNOSIS — F40240 Claustrophobia: Secondary | ICD-10-CM | POA: Diagnosis present

## 2023-06-03 DIAGNOSIS — Z888 Allergy status to other drugs, medicaments and biological substances status: Secondary | ICD-10-CM

## 2023-06-03 DIAGNOSIS — Z7985 Long-term (current) use of injectable non-insulin antidiabetic drugs: Secondary | ICD-10-CM

## 2023-06-03 DIAGNOSIS — N179 Acute kidney failure, unspecified: Principal | ICD-10-CM | POA: Diagnosis present

## 2023-06-03 DIAGNOSIS — M1990 Unspecified osteoarthritis, unspecified site: Secondary | ICD-10-CM | POA: Diagnosis present

## 2023-06-03 LAB — COMPREHENSIVE METABOLIC PANEL
ALT: 32 U/L (ref 0–44)
AST: 43 U/L — ABNORMAL HIGH (ref 15–41)
Albumin: 3.7 g/dL (ref 3.5–5.0)
Alkaline Phosphatase: 59 U/L (ref 38–126)
Anion gap: 13 (ref 5–15)
BUN: 21 mg/dL — ABNORMAL HIGH (ref 6–20)
CO2: 37 mmol/L — ABNORMAL HIGH (ref 22–32)
Calcium: 9.3 mg/dL (ref 8.9–10.3)
Chloride: 86 mmol/L — ABNORMAL LOW (ref 98–111)
Creatinine, Ser: 1.39 mg/dL — ABNORMAL HIGH (ref 0.44–1.00)
GFR, Estimated: 45 mL/min — ABNORMAL LOW (ref >60)
Glucose, Bld: 177 mg/dL — ABNORMAL HIGH (ref 70–99)
Potassium: 4.7 mmol/L (ref 3.5–5.1)
Sodium: 136 mmol/L (ref 135–145)
Total Bilirubin: 0.4 mg/dL (ref 0.3–1.2)
Total Protein: 6.8 g/dL (ref 6.5–8.1)

## 2023-06-03 LAB — CBC WITH DIFFERENTIAL/PLATELET
Abs Immature Granulocytes: 0.11 10*3/uL — ABNORMAL HIGH (ref 0.00–0.07)
Basophils Absolute: 0 10*3/uL (ref 0.0–0.1)
Basophils Relative: 0 %
Eosinophils Absolute: 0.3 10*3/uL (ref 0.0–0.5)
Eosinophils Relative: 3 %
HCT: 32.6 % — ABNORMAL LOW (ref 36.0–46.0)
Hemoglobin: 9.8 g/dL — ABNORMAL LOW (ref 12.0–15.0)
Immature Granulocytes: 1 %
Lymphocytes Relative: 14 %
Lymphs Abs: 1.4 10*3/uL (ref 0.7–4.0)
MCH: 31 pg (ref 26.0–34.0)
MCHC: 30.1 g/dL (ref 30.0–36.0)
MCV: 103.2 fL — ABNORMAL HIGH (ref 80.0–100.0)
Monocytes Absolute: 0.6 10*3/uL (ref 0.1–1.0)
Monocytes Relative: 6 %
Neutro Abs: 7.7 10*3/uL (ref 1.7–7.7)
Neutrophils Relative %: 76 %
Platelets: 190 10*3/uL (ref 150–400)
RBC: 3.16 MIL/uL — ABNORMAL LOW (ref 3.87–5.11)
RDW: 15.1 % (ref 11.5–15.5)
WBC: 10.1 10*3/uL (ref 4.0–10.5)
nRBC: 0.6 % — ABNORMAL HIGH (ref 0.0–0.2)

## 2023-06-03 LAB — BRAIN NATRIURETIC PEPTIDE: B Natriuretic Peptide: 30 pg/mL (ref 0.0–100.0)

## 2023-06-03 LAB — BLOOD GAS, VENOUS
Acid-Base Excess: 24.2 mmol/L — ABNORMAL HIGH (ref 0.0–2.0)
Bicarbonate: 52.4 mmol/L — ABNORMAL HIGH (ref 20.0–28.0)
Drawn by: 53341
O2 Saturation: 89.8 %
Patient temperature: 36.9
pCO2, Ven: 79 mm[Hg] (ref 44–60)
pH, Ven: 7.43 (ref 7.25–7.43)
pO2, Ven: 55 mm[Hg] — ABNORMAL HIGH (ref 32–45)

## 2023-06-03 LAB — TROPONIN I (HIGH SENSITIVITY): Troponin I (High Sensitivity): 12 ng/L (ref <18)

## 2023-06-03 MED ORDER — FUROSEMIDE 10 MG/ML IJ SOLN
40.0000 mg | Freq: Once | INTRAMUSCULAR | Status: AC
  Administered 2023-06-03: 40 mg via INTRAVENOUS
  Filled 2023-06-03: qty 4

## 2023-06-03 MED ORDER — MORPHINE SULFATE (PF) 4 MG/ML IV SOLN
4.0000 mg | Freq: Once | INTRAVENOUS | Status: AC
  Administered 2023-06-03: 4 mg via INTRAVENOUS
  Filled 2023-06-03: qty 1

## 2023-06-03 MED ORDER — ONDANSETRON HCL 4 MG/2ML IJ SOLN
4.0000 mg | Freq: Once | INTRAMUSCULAR | Status: AC
  Administered 2023-06-03: 4 mg via INTRAVENOUS
  Filled 2023-06-03: qty 2

## 2023-06-03 NOTE — ED Provider Notes (Signed)
[place] EMERGENCY DEPARTMENT AT [facility] Provider Note   CSN: [id number] Arrival date & time: 06/03/23  2120     History {Add pertinent medical, surgical, social history, OB history to HPI:1} Chief Complaint  Patient presents with   Jennifer Odonnell is a 57 y.o. female.  Patient presents after a fall.  Patient reports that her legs gave out and she fell, injuring both of her knees.  Patient reports that her knees have been hurting more than usual for the last few days.  She has a constant aching pain but there is also shooting pain that is only brief in nature that occurs.  This may have caused the fall.  Patient reports that she has significant swelling of both of her legs including blistering of the skin.  Swelling started about 3 weeks ago.  She was started on Lasix by her primary care doctor, 20 mg a day.  She was also on an extended course of antibiotics for the erythema of the legs.  Patient reports that for the first few days she was taking Lasix the swelling went down but it is now back to where it was.  Patient chronically on oxygen, 8 L by nasal cannula.  She reports that she feels okay at rest but she has significant shortness of breath with walking only a few steps which is new.       Home Medications Prior to Admission medications   Medication Sig Start Date End Date Taking? Authorizing Provider  albuterol (PROVENTIL HFA;VENTOLIN HFA) 108 (90 BASE) MCG/ACT inhaler Inhale 2 puffs into the lungs every 6 (six) hours as needed for wheezing or shortness of breath.    [provider]  albuterol (PROVENTIL) (2.5 MG/3ML) 0.083% nebulizer solution Take 3 mLs (2.5 mg total) by nebulization every 4 (four) hours as needed for wheezing or shortness of breath. 09/11/20   Johnson, [name], MD  ascorbic acid (VITAMIN C) 500 MG tablet Take 500 mg by mouth daily.    [provider]  aspirin 81 MG EC tablet Take 81 mg by mouth daily.    [provider]  atenolol (TENORMIN) 25 MG tablet Take 25 mg by mouth daily. 08/14/20   [provider]  budesonide (PULMICORT) 0.5 MG/2ML nebulizer solution Take 0.5 mg by nebulization 2 (two) times daily. 08/14/20   [provider]  carisoprodol (SOMA) 350 MG tablet Take 350 mg by mouth 3 (three) times daily. 08/04/20   [provider]  colchicine 0.6 MG tablet Take 0.6 mg by mouth daily. 09/04/20   [provider]  diclofenac Sodium (VOLTAREN) 1 % GEL Apply 1 g topically 4 (four) times daily. 08/14/20   [provider]  docusate sodium (COLACE) 100 MG capsule Take 100 mg by mouth 2 (two) times daily.    [provider]  fenofibrate (TRICOR) 48 MG tablet Take 48 mg by mouth daily. 08/14/20   [provider]  FEROSUL 325 (65 Fe) MG tablet Take 325 mg by mouth daily. 08/15/20   [provider]  folic acid (FOLVITE) 1 MG tablet Take 1 mg by mouth daily. 08/14/20   [provider]  glipiZIDE (GLUCOTROL XL) 2.5 MG 24 hr tablet Take 2.5 mg by mouth every morning. 08/14/20   [provider]  guaiFENesin-codeine 100-10 MG/5ML syrup Take 10 mLs by mouth every 6 (six) hours as needed for cough. 09/11/20   Johnson, [name], MD  hydroxychloroquine (PLAQUENIL) 200 MG tablet  Take 400 mg by mouth daily. 08/14/20   [provider]  ipratropium-albuterol (DUONEB) 0.5-2.5 (3) MG/3ML SOLN Take 3 mLs by nebulization 3 (three) times daily. 08/14/20   [provider]  LANTUS SOLOSTAR 100 UNIT/ML Solostar Pen Inject 44 Units into the skin 2 (two) times daily. 09/04/20   [provider]  losartan-hydrochlorothiazide (HYZAAR) 100-12.5 MG tablet Take 1 tablet by mouth daily. 08/14/20   [provider]  metFORMIN (GLUCOPHAGE) 1000 MG tablet Take 1,000 mg by mouth 2 (two) times daily with a meal.    [provider]  Multiple Vitamins-Calcium (ONE-A-DAY WOMENS FORMULA) TABS Take 1 tablet by mouth  daily.    [provider]  nystatin ointment (MYCOSTATIN) Apply 1 application topically 2 (two) times daily.    [provider]  omeprazole (PRILOSEC) 40 MG capsule Take 40 mg by mouth daily. 08/15/20   [provider]  Oxycodone HCl 10 MG TABS Take 10 mg by mouth 4 (four) times daily. 08/25/20   [provider]  oxyCODONE-acetaminophen (PERCOCET) 5-325 MG tablet Take 1 tablet by mouth every 6 (six) hours as needed for moderate pain or severe pain. 06/23/22   Fayrene Helper, PA-C  predniSONE (DELTASONE) 20 MG tablet Take 3 PO QAM x5days, 2 PO QAM x5days, 1 PO QAM x5days, then resume 10 mg QD 09/12/20   Johnson, [name], MD  PREMARIN 0.625 MG tablet Take 0.625 mg by mouth daily. 08/14/20   [provider]  promethazine (PHENERGAN) 25 MG tablet Take 25 mg by mouth every 12 (twelve) hours.    [provider]  simvastatin (ZOCOR) 40 MG tablet Take 40 mg by mouth every evening. 08/14/20   [provider]  sitaGLIPtin (JANUVIA) 100 MG tablet Take 100 mg by mouth daily.    [provider]  TRULICITY 0.75 MG/0.5ML SOPN Inject 0.75 mg into the skin once a week. 09/04/20   [provider]  Vitamin D, Ergocalciferol, (DRISDOL) 1.25 MG (50000 UNIT) CAPS capsule Take 50,000 Units by mouth once a week. 08/14/20   [provider]      Allergies    Daucus carota, Lisinopril, Tiotropium, Erythromycin, Ambien [zolpidem], and Spiriva handihaler [tiotropium bromide monohydrate]    Review of Systems   Review of Systems  Physical Exam Updated Vital Signs BP 134/69   Pulse 82   Temp 98.4 F (36.9 C) (Oral)   Resp 18   Ht 5\' 8"  (1.727 m)   Wt (!) 158.8 kg   SpO2 96%   BMI 53.22 kg/m  Physical Exam Vitals and nursing note reviewed.  Constitutional:      General: She is not in acute distress.    Appearance: She is well-developed. She is obese.  HENT:     Head: Normocephalic and atraumatic.     Mouth/Throat:      Mouth: Mucous membranes are moist.  Eyes:     General: Vision grossly intact. Gaze aligned appropriately.     Extraocular Movements: Extraocular movements intact.     Conjunctiva/sclera: Conjunctivae normal.  Cardiovascular:     Rate and Rhythm: Normal rate and regular rhythm.     Pulses: Normal pulses.     Heart sounds: Normal heart sounds, S1 normal and S2 normal. No murmur heard.    No friction rub. No gallop.  Pulmonary:     Effort: Accessory muscle usage present.     Breath sounds: Normal breath sounds. Decreased air movement present.  Abdominal:     General: Bowel sounds  are normal.     Palpations: Abdomen is soft.     Tenderness: There is no abdominal tenderness. There is no guarding or rebound.     Hernia: No hernia is present.  Musculoskeletal:        General: No swelling.     Cervical back: Full passive range of motion without pain, normal range of motion and neck supple. No spinous process tenderness or muscular tenderness. Normal range of motion.     Right lower leg: No edema.     Left lower leg: No edema.  Skin:    General: Skin is warm and dry.     Capillary Refill: Capillary refill takes less than 2 seconds.     Findings: Erythema (Right lower leg, some associated scabs and there are fluid-filled blisters on the back part of the leg) present. No ecchymosis, rash or wound.  Neurological:     General: No focal deficit present.     Mental Status: She is alert and oriented to person, place, and time.     GCS: GCS eye subscore is 4. GCS verbal subscore is 5. GCS motor subscore is 6.     Cranial Nerves: Cranial nerves 2-12 are intact.     Sensory: Sensation is intact.     Motor: Motor function is intact.     Coordination: Coordination is intact.  Psychiatric:        Attention and Perception: Attention normal.        Mood and Affect: Mood normal.        Speech: Speech normal.        Behavior: Behavior normal.     ED Results / Procedures / Treatments   Labs (all  labs ordered are listed, but only abnormal results are displayed) Labs Reviewed  CBC WITH DIFFERENTIAL/PLATELET - Abnormal; Notable for the following components:      Result Value   RBC 3.16 (*)    Hemoglobin 9.8 (*)    HCT 32.6 (*)    MCV 103.2 (*)    nRBC 0.6 (*)    Abs Immature Granulocytes 0.11 (*)    All other components within normal limits  COMPREHENSIVE METABOLIC PANEL - Abnormal; Notable for the following components:   Chloride 86 (*)    CO2 37 (*)    Glucose, Bld 177 (*)    BUN 21 (*)    Creatinine, Ser 1.39 (*)    AST 43 (*)    GFR, Estimated 45 (*)    All other components within normal limits  BLOOD GAS, VENOUS - Abnormal; Notable for the following components:   pCO2, Ven 79 (*)    pO2, Ven 55 (*)    Bicarbonate 52.4 (*)    Acid-Base Excess 24.2 (*)    All other components within normal limits  URINALYSIS, ROUTINE W REFLEX MICROSCOPIC  BRAIN NATRIURETIC PEPTIDE  TROPONIN I (HIGH SENSITIVITY)    EKG EKG Interpretation Date/Time:  Tuesday June 03 2023 21:48:45 EDT Ventricular Rate:  81 PR Interval:    QRS Duration:  85 QT Interval:  363 QTC Calculation: 422 R Axis:   10  Text Interpretation: sinus rhythm Low voltage, precordial leads No significant change since prior 10/23 Confirmed by Meridee Score [phone] on 06/03/2023 9:52:29 PM  Radiology DG Chest Portable 1 View  Result Date: 06/03/2023 CLINICAL DATA:  Recent fall with chest pain, initial EXAM: PORTABLE CHEST 1 VIEW COMPARISON:  01/28/2023 FINDINGS: The heart size and mediastinal contours are within normal limits. Both lungs are  clear. The visualized skeletal structures are unremarkable. IMPRESSION: No active disease. Electronically Signed   By: Alcide Clever M.D.   On: 06/03/2023 22:59   DG Knee Complete 4 Views Left  Result Date: 06/03/2023 CLINICAL DATA:  Fall and trauma to the left knee.  Pain. EXAM: LEFT KNEE - COMPLETE 4+ VIEW COMPARISON:  None Available. FINDINGS: There is no acute fracture  or dislocation. Moderate arthritic changes of the knee with tricompartmental narrowing and spurring. No significant joint effusion. Fixation screws of the anterior proximal tibia. The soft tissues are grossly unremarkable. IMPRESSION: 1. No acute fracture or dislocation. 2. Moderate arthritic changes. Electronically Signed   By: Elgie Collard M.D.   On: 06/03/2023 22:55    Procedures Procedures  {Document cardiac monitor, telemetry assessment procedure when appropriate:1}  Medications Ordered in ED Medications  ondansetron (ZOFRAN) injection 4 mg (has no administration in time range)  morphine (PF) 4 MG/ML injection 4 mg (has no administration in time range)    ED Course/ Medical Decision Making/ A&P   {   Click here for ABCD2, HEART and other calculatorsREFRESH Note before signing :1}                              Medical Decision Making Amount and/or Complexity of Data Reviewed Labs: ordered. Radiology: ordered.  Risk Prescription drug management.   ***  {Document critical care time when appropriate:1} {Document review of labs and clinical decision tools ie heart score, Chads2Vasc2 etc:1}  {Document your independent review of radiology images, and any outside records:1} {Document your discussion with family members, caretakers, and with consultants:1} {Document social determinants of health affecting pt's care:1} {Document your decision making why or why not admission, treatments were needed:1} Final Clinical Impression(s) / ED Diagnoses Final diagnoses:  None    Rx / DC Orders ED Discharge Orders     None

## 2023-06-03 NOTE — ED Notes (Signed)
RT called and informed of pt in ED

## 2023-06-03 NOTE — ED Notes (Signed)
Introduced self to pt Pt attached to full monitor Pt morbidly obese, sores on legs and working hard to breathe Pt stated her breathing is "about normal" but she is wheezing more lately. Pt on 8LPM O2 via [place], stated that she has been wearing 8-10LPM at home and drops to the 70's when walking.  Today in kitchen pt stated both knees buckled and she fell to LEFT knee. PT stated she felt both knees pop. Vitals listed. Lab and xray orders placed in system  Waiting on MD eval

## 2023-06-03 NOTE — ED Notes (Signed)
VBG PCO2 79% Informed MD

## 2023-06-03 NOTE — ED Notes (Signed)
Pt requested restroom Pt unable to walk on her own and requires 8LPM O2  Bariatric bedside commode placed at bedside  Pt aware that UA is requested

## 2023-06-03 NOTE — ED Triage Notes (Signed)
Pt presents with fall from this am, fell from sitting to standing position, tripping over oxygen tubing, hitting left knee and head, denies LOC, pt has c/o of increased sleepiness over past several days, chronic on 8 liters, COPD.

## 2023-06-04 ENCOUNTER — Other Ambulatory Visit (HOSPITAL_COMMUNITY): Payer: Self-pay | Admitting: *Deleted

## 2023-06-04 ENCOUNTER — Inpatient Hospital Stay (HOSPITAL_COMMUNITY): Payer: Medicaid Other

## 2023-06-04 DIAGNOSIS — F40240 Claustrophobia: Secondary | ICD-10-CM | POA: Diagnosis present

## 2023-06-04 DIAGNOSIS — J441 Chronic obstructive pulmonary disease with (acute) exacerbation: Secondary | ICD-10-CM

## 2023-06-04 DIAGNOSIS — W1839XA Other fall on same level, initial encounter: Secondary | ICD-10-CM | POA: Diagnosis present

## 2023-06-04 DIAGNOSIS — J9601 Acute respiratory failure with hypoxia: Secondary | ICD-10-CM | POA: Diagnosis not present

## 2023-06-04 DIAGNOSIS — R0602 Shortness of breath: Secondary | ICD-10-CM | POA: Diagnosis present

## 2023-06-04 DIAGNOSIS — J9602 Acute respiratory failure with hypercapnia: Secondary | ICD-10-CM | POA: Diagnosis not present

## 2023-06-04 DIAGNOSIS — Y9301 Activity, walking, marching and hiking: Secondary | ICD-10-CM | POA: Diagnosis present

## 2023-06-04 DIAGNOSIS — E1122 Type 2 diabetes mellitus with diabetic chronic kidney disease: Secondary | ICD-10-CM | POA: Diagnosis present

## 2023-06-04 DIAGNOSIS — Z87891 Personal history of nicotine dependence: Secondary | ICD-10-CM | POA: Diagnosis not present

## 2023-06-04 DIAGNOSIS — Z6841 Body Mass Index (BMI) 40.0 and over, adult: Secondary | ICD-10-CM | POA: Diagnosis not present

## 2023-06-04 DIAGNOSIS — Z1152 Encounter for screening for COVID-19: Secondary | ICD-10-CM | POA: Diagnosis not present

## 2023-06-04 DIAGNOSIS — Z91199 Patient's noncompliance with other medical treatment and regimen due to unspecified reason: Secondary | ICD-10-CM | POA: Diagnosis not present

## 2023-06-04 DIAGNOSIS — Z833 Family history of diabetes mellitus: Secondary | ICD-10-CM | POA: Diagnosis not present

## 2023-06-04 DIAGNOSIS — M1990 Unspecified osteoarthritis, unspecified site: Secondary | ICD-10-CM | POA: Diagnosis present

## 2023-06-04 DIAGNOSIS — N179 Acute kidney failure, unspecified: Secondary | ICD-10-CM | POA: Diagnosis present

## 2023-06-04 DIAGNOSIS — Z794 Long term (current) use of insulin: Secondary | ICD-10-CM | POA: Diagnosis not present

## 2023-06-04 DIAGNOSIS — E662 Morbid (severe) obesity with alveolar hypoventilation: Secondary | ICD-10-CM | POA: Diagnosis present

## 2023-06-04 DIAGNOSIS — I110 Hypertensive heart disease with heart failure: Secondary | ICD-10-CM | POA: Diagnosis present

## 2023-06-04 DIAGNOSIS — E119 Type 2 diabetes mellitus without complications: Secondary | ICD-10-CM

## 2023-06-04 DIAGNOSIS — Z8249 Family history of ischemic heart disease and other diseases of the circulatory system: Secondary | ICD-10-CM | POA: Diagnosis not present

## 2023-06-04 DIAGNOSIS — M341 CR(E)ST syndrome: Secondary | ICD-10-CM

## 2023-06-04 DIAGNOSIS — I509 Heart failure, unspecified: Secondary | ICD-10-CM | POA: Diagnosis present

## 2023-06-04 DIAGNOSIS — Z8541 Personal history of malignant neoplasm of cervix uteri: Secondary | ICD-10-CM | POA: Diagnosis not present

## 2023-06-04 DIAGNOSIS — G8929 Other chronic pain: Secondary | ICD-10-CM | POA: Diagnosis present

## 2023-06-04 DIAGNOSIS — J9621 Acute and chronic respiratory failure with hypoxia: Secondary | ICD-10-CM | POA: Diagnosis present

## 2023-06-04 DIAGNOSIS — Y92009 Unspecified place in unspecified non-institutional (private) residence as the place of occurrence of the external cause: Secondary | ICD-10-CM | POA: Diagnosis not present

## 2023-06-04 DIAGNOSIS — E1140 Type 2 diabetes mellitus with diabetic neuropathy, unspecified: Secondary | ICD-10-CM | POA: Diagnosis present

## 2023-06-04 DIAGNOSIS — Z5329 Procedure and treatment not carried out because of patient's decision for other reasons: Secondary | ICD-10-CM | POA: Diagnosis not present

## 2023-06-04 DIAGNOSIS — I10 Essential (primary) hypertension: Secondary | ICD-10-CM

## 2023-06-04 DIAGNOSIS — Z825 Family history of asthma and other chronic lower respiratory diseases: Secondary | ICD-10-CM | POA: Diagnosis not present

## 2023-06-04 DIAGNOSIS — Z8261 Family history of arthritis: Secondary | ICD-10-CM | POA: Diagnosis not present

## 2023-06-04 DIAGNOSIS — Z90711 Acquired absence of uterus with remaining cervical stump: Secondary | ICD-10-CM | POA: Diagnosis not present

## 2023-06-04 DIAGNOSIS — W19XXXA Unspecified fall, initial encounter: Secondary | ICD-10-CM

## 2023-06-04 LAB — IRON AND TIBC
Iron: 60 ug/dL (ref 28–170)
Saturation Ratios: 13 % (ref 10.4–31.8)
TIBC: 472 ug/dL — ABNORMAL HIGH (ref 250–450)
UIBC: 412 ug/dL

## 2023-06-04 LAB — URINALYSIS, ROUTINE W REFLEX MICROSCOPIC
Bilirubin Urine: NEGATIVE
Glucose, UA: NEGATIVE mg/dL
Hgb urine dipstick: NEGATIVE
Ketones, ur: NEGATIVE mg/dL
Leukocytes,Ua: NEGATIVE
Nitrite: NEGATIVE
Protein, ur: 30 mg/dL — AB
Specific Gravity, Urine: 1.016 (ref 1.005–1.030)
pH: 7 (ref 5.0–8.0)

## 2023-06-04 LAB — CBC WITH DIFFERENTIAL/PLATELET
Abs Immature Granulocytes: 0.11 10*3/uL — ABNORMAL HIGH (ref 0.00–0.07)
Basophils Absolute: 0 10*3/uL (ref 0.0–0.1)
Basophils Relative: 0 %
Eosinophils Absolute: 0.3 10*3/uL (ref 0.0–0.5)
Eosinophils Relative: 3 %
HCT: 30.6 % — ABNORMAL LOW (ref 36.0–46.0)
Hemoglobin: 9.3 g/dL — ABNORMAL LOW (ref 12.0–15.0)
Immature Granulocytes: 1 %
Lymphocytes Relative: 16 %
Lymphs Abs: 1.4 10*3/uL (ref 0.7–4.0)
MCH: 31.6 pg (ref 26.0–34.0)
MCHC: 30.4 g/dL (ref 30.0–36.0)
MCV: 104.1 fL — ABNORMAL HIGH (ref 80.0–100.0)
Monocytes Absolute: 0.7 10*3/uL (ref 0.1–1.0)
Monocytes Relative: 8 %
Neutro Abs: 6.2 10*3/uL (ref 1.7–7.7)
Neutrophils Relative %: 72 %
Platelets: 189 10*3/uL (ref 150–400)
RBC: 2.94 MIL/uL — ABNORMAL LOW (ref 3.87–5.11)
RDW: 15.4 % (ref 11.5–15.5)
WBC: 8.7 10*3/uL (ref 4.0–10.5)
nRBC: 0.5 % — ABNORMAL HIGH (ref 0.0–0.2)

## 2023-06-04 LAB — HIV ANTIBODY (ROUTINE TESTING W REFLEX): HIV Screen 4th Generation wRfx: NONREACTIVE

## 2023-06-04 LAB — COMPREHENSIVE METABOLIC PANEL
ALT: 31 U/L (ref 0–44)
AST: 46 U/L — ABNORMAL HIGH (ref 15–41)
Albumin: 3.4 g/dL — ABNORMAL LOW (ref 3.5–5.0)
Alkaline Phosphatase: 55 U/L (ref 38–126)
Anion gap: 12 (ref 5–15)
BUN: 23 mg/dL — ABNORMAL HIGH (ref 6–20)
CO2: 40 mmol/L — ABNORMAL HIGH (ref 22–32)
Calcium: 9.2 mg/dL (ref 8.9–10.3)
Chloride: 85 mmol/L — ABNORMAL LOW (ref 98–111)
Creatinine, Ser: 1.48 mg/dL — ABNORMAL HIGH (ref 0.44–1.00)
GFR, Estimated: 41 mL/min — ABNORMAL LOW (ref >60)
Glucose, Bld: 231 mg/dL — ABNORMAL HIGH (ref 70–99)
Potassium: 4.1 mmol/L (ref 3.5–5.1)
Sodium: 137 mmol/L (ref 135–145)
Total Bilirubin: 0.4 mg/dL (ref 0.3–1.2)
Total Protein: 6.3 g/dL — ABNORMAL LOW (ref 6.5–8.1)

## 2023-06-04 LAB — HEMOGLOBIN A1C
Hgb A1c MFr Bld: 8.5 % — ABNORMAL HIGH (ref 4.8–5.6)
Mean Plasma Glucose: 197.25 mg/dL

## 2023-06-04 LAB — GLUCOSE, CAPILLARY
Glucose-Capillary: 270 mg/dL — ABNORMAL HIGH (ref 70–99)
Glucose-Capillary: 267 mg/dL — ABNORMAL HIGH (ref 70–99)
Glucose-Capillary: 298 mg/dL — ABNORMAL HIGH (ref 70–99)
Glucose-Capillary: 232 mg/dL — ABNORMAL HIGH (ref 70–99)

## 2023-06-04 LAB — SARS CORONAVIRUS 2 BY RT PCR: SARS Coronavirus 2 by RT PCR: NEGATIVE

## 2023-06-04 LAB — ECHOCARDIOGRAM COMPLETE
Height: 68 in
Weight: 5742.54 [oz_av]

## 2023-06-04 LAB — TROPONIN I (HIGH SENSITIVITY): Troponin I (High Sensitivity): 11 ng/L (ref <18)

## 2023-06-04 LAB — FOLATE: Folate: >40 ng/mL (ref >5.9)

## 2023-06-04 LAB — MAGNESIUM: Magnesium: 1.8 mg/dL (ref 1.7–2.4)

## 2023-06-04 LAB — VITAMIN D 25 HYDROXY (VIT D DEFICIENCY, FRACTURES): Vit D, 25-Hydroxy: 57.03 ng/mL (ref 30–100)

## 2023-06-04 LAB — PROCALCITONIN: Procalcitonin: <0.1 ng/mL

## 2023-06-04 LAB — VITAMIN B12: Vitamin B-12: 1063 pg/mL — ABNORMAL HIGH (ref 180–914)

## 2023-06-04 MED ORDER — OXYCODONE HCL 5 MG PO TABS
5.0000 mg | ORAL_TABLET | ORAL | Status: DC | PRN
  Administered 2023-06-04: 5 mg via ORAL
  Filled 2023-06-04: qty 1

## 2023-06-04 MED ORDER — NICOTINE 21 MG/24HR TD PT24
21.0000 mg | MEDICATED_PATCH | Freq: Every day | TRANSDERMAL | Status: DC
  Administered 2023-06-04: 21 mg via TRANSDERMAL
  Filled 2023-06-04: qty 1

## 2023-06-04 MED ORDER — INSULIN ASPART 100 UNIT/ML IJ SOLN
0.0000 [IU] | Freq: Three times a day (TID) | INTRAMUSCULAR | Status: DC
  Administered 2023-06-04 (×3): 8 [IU] via SUBCUTANEOUS

## 2023-06-04 MED ORDER — INSULIN GLARGINE-YFGN 100 UNIT/ML ~~LOC~~ SOLN
30.0000 [IU] | Freq: Every day | SUBCUTANEOUS | Status: DC
  Administered 2023-06-04: 30 [IU] via SUBCUTANEOUS
  Filled 2023-06-04 (×2): qty 0.3

## 2023-06-04 MED ORDER — ATENOLOL 25 MG PO TABS
25.0000 mg | ORAL_TABLET | Freq: Every day | ORAL | Status: DC
  Administered 2023-06-04: 25 mg via ORAL
  Filled 2023-06-04: qty 1

## 2023-06-04 MED ORDER — SIMVASTATIN 20 MG PO TABS
40.0000 mg | ORAL_TABLET | Freq: Every evening | ORAL | Status: DC
  Administered 2023-06-04: 40 mg via ORAL
  Filled 2023-06-04: qty 2

## 2023-06-04 MED ORDER — ACETAMINOPHEN 650 MG RE SUPP: 650.0000 mg | Freq: Four times a day (QID) | RECTAL | Status: DC | PRN

## 2023-06-04 MED ORDER — BUDESONIDE 0.5 MG/2ML IN SUSP: 0.5000 mg | Freq: Two times a day (BID) | RESPIRATORY_TRACT | Status: DC

## 2023-06-04 MED ORDER — ONDANSETRON HCL 4 MG PO TABS: 4.0000 mg | ORAL_TABLET | Freq: Four times a day (QID) | ORAL | Status: DC | PRN

## 2023-06-04 MED ORDER — ALBUTEROL SULFATE (2.5 MG/3ML) 0.083% IN NEBU
2.5000 mg | INHALATION_SOLUTION | Freq: Four times a day (QID) | RESPIRATORY_TRACT | Status: DC
  Administered 2023-06-04 – 2023-06-05 (×4): 2.5 mg via RESPIRATORY_TRACT
  Filled 2023-06-04 (×5): qty 3

## 2023-06-04 MED ORDER — FUROSEMIDE 10 MG/ML IJ SOLN
40.0000 mg | Freq: Two times a day (BID) | INTRAMUSCULAR | Status: DC
  Administered 2023-06-04 (×2): 40 mg via INTRAVENOUS
  Filled 2023-06-04 (×2): qty 4

## 2023-06-04 MED ORDER — HYDROXYCHLOROQUINE SULFATE 200 MG PO TABS
400.0000 mg | ORAL_TABLET | Freq: Every day | ORAL | Status: DC
  Administered 2023-06-04: 400 mg via ORAL
  Filled 2023-06-04 (×2): qty 2

## 2023-06-04 MED ORDER — HYDROMORPHONE HCL 1 MG/ML IJ SOLN
0.5000 mg | INTRAMUSCULAR | Status: DC | PRN
  Administered 2023-06-04 (×3): 0.5 mg via INTRAVENOUS
  Filled 2023-06-04 (×3): qty 0.5

## 2023-06-04 MED ORDER — HEPARIN SODIUM (PORCINE) 5000 UNIT/ML IJ SOLN
5000.0000 [IU] | Freq: Three times a day (TID) | INTRAMUSCULAR | Status: DC
  Administered 2023-06-04 – 2023-06-05 (×4): 5000 [IU] via SUBCUTANEOUS
  Filled 2023-06-04 (×4): qty 1

## 2023-06-04 MED ORDER — FENOFIBRATE 54 MG PO TABS
54.0000 mg | ORAL_TABLET | Freq: Every day | ORAL | Status: DC
  Administered 2023-06-04: 54 mg via ORAL
  Filled 2023-06-04 (×6): qty 1

## 2023-06-04 MED ORDER — ACETAMINOPHEN 325 MG PO TABS: 650.0000 mg | ORAL_TABLET | Freq: Four times a day (QID) | ORAL | Status: DC | PRN

## 2023-06-04 MED ORDER — MOMETASONE FURO-FORMOTEROL FUM 200-5 MCG/ACT IN AERO
2.0000 | INHALATION_SPRAY | Freq: Two times a day (BID) | RESPIRATORY_TRACT | Status: DC
  Administered 2023-06-04: 2 via RESPIRATORY_TRACT
  Filled 2023-06-04: qty 8.8

## 2023-06-04 MED ORDER — CYCLOBENZAPRINE HCL 10 MG PO TABS
10.0000 mg | ORAL_TABLET | Freq: Three times a day (TID) | ORAL | Status: DC
  Administered 2023-06-04 (×2): 10 mg via ORAL
  Filled 2023-06-04 (×2): qty 1

## 2023-06-04 MED ORDER — NICOTINE 21 MG/24HR TD PT24: 21.0000 mg | MEDICATED_PATCH | Freq: Every day | TRANSDERMAL | Status: DC

## 2023-06-04 MED ORDER — INFLUENZA VIRUS VACC SPLIT PF (FLUZONE) 0.5 ML IM SUSY: 0.5000 mL | PREFILLED_SYRINGE | INTRAMUSCULAR | Status: DC

## 2023-06-04 MED ORDER — ASPIRIN 81 MG PO TBEC
81.0000 mg | DELAYED_RELEASE_TABLET | Freq: Every day | ORAL | Status: DC
  Administered 2023-06-04: 81 mg via ORAL
  Filled 2023-06-04: qty 1

## 2023-06-04 MED ORDER — SERTRALINE HCL 50 MG PO TABS
50.0000 mg | ORAL_TABLET | Freq: Every day | ORAL | Status: DC
  Administered 2023-06-04: 50 mg via ORAL
  Filled 2023-06-04: qty 1

## 2023-06-04 MED ORDER — ALBUTEROL SULFATE (2.5 MG/3ML) 0.083% IN NEBU: 2.5000 mg | INHALATION_SOLUTION | Freq: Four times a day (QID) | RESPIRATORY_TRACT | Status: DC

## 2023-06-04 MED ORDER — BUDESONIDE 0.5 MG/2ML IN SUSP
0.5000 mg | Freq: Two times a day (BID) | RESPIRATORY_TRACT | Status: DC
  Administered 2023-06-04 (×2): 0.5 mg via RESPIRATORY_TRACT
  Filled 2023-06-04 (×3): qty 2

## 2023-06-04 MED ORDER — ESTROGENS CONJUGATED 0.625 MG PO TABS
0.6250 mg | ORAL_TABLET | Freq: Every day | ORAL | Status: DC
  Administered 2023-06-04: 0.625 mg via ORAL
  Filled 2023-06-04 (×3): qty 1

## 2023-06-04 MED ORDER — ONDANSETRON HCL 4 MG/2ML IJ SOLN
4.0000 mg | Freq: Four times a day (QID) | INTRAMUSCULAR | Status: DC | PRN
  Administered 2023-06-05: 4 mg via INTRAVENOUS
  Filled 2023-06-04 (×2): qty 2

## 2023-06-04 MED ORDER — CARISOPRODOL 350 MG PO TABS
350.0000 mg | ORAL_TABLET | Freq: Three times a day (TID) | ORAL | Status: DC
  Administered 2023-06-04 (×2): 350 mg via ORAL
  Filled 2023-06-04 (×2): qty 1

## 2023-06-04 MED ORDER — INSULIN ASPART 100 UNIT/ML IJ SOLN
0.0000 [IU] | Freq: Every day | INTRAMUSCULAR | Status: DC
  Administered 2023-06-04: 2 [IU] via SUBCUTANEOUS

## 2023-06-04 MED ORDER — OXYCODONE HCL 5 MG PO TABS
10.0000 mg | ORAL_TABLET | Freq: Four times a day (QID) | ORAL | Status: DC | PRN
  Administered 2023-06-04 – 2023-06-05 (×2): 10 mg via ORAL
  Filled 2023-06-04 (×2): qty 2

## 2023-06-04 NOTE — Plan of Care (Signed)
  Problem: Education: Goal: Knowledge of General Education information will improve Description: Including pain rating scale, medication(s)/side effects and non-pharmacologic comfort measures Outcome: Not Progressing   Problem: Health Behavior/Discharge Planning: Goal: Ability to manage health-related needs will improve Outcome: Not Progressing   

## 2023-06-04 NOTE — Evaluation (Signed)
Occupational Therapy Evaluation Patient Details Name: Jennifer Odonnell MRN: [id number] DOB: 05/01/1966 Today's Date: 06/04/2023   History of Present Illness Jennifer Odonnell is a 57 y.o. female with medical history significant of arthritis, cancer, COPD, crest syndrome, diabetes mellitus type 2, hypertension, neuropathy, chronic respiratory failure on 8 L nasal cannula at home secondary to obesity hypoventilation syndrome/COPD, presents to the ED with a chief complaint of fall.  Patient reports that she has been progressively more short of breath over the past 3 weeks.  Her shortness of breath is worse with exertion.  She has no chest pain, no palpitations.  Today she was trying to walk across the room when she was so short of breath that her legs gave out and she fell onto her knees.  She did not hit her head and she did not have loss of consciousness.  Patient reports that she ambulates less and less these days due to pain in her knees and her back as well as shortness of breath.  She is concerned that her "fluid flares" are coming back more and more often.  Patient reports that per her PCP she is supposed to take 20 mg of Lasix daily, when she starts to have a flare she should take 20 mg of Lasix twice daily.  If that does not help after couple of days she supposed to increase to 40 mg of Lasix twice daily.  Patient reports that she did that whole regimen 3 weeks ago.  She was also on an antibiotic at some point for bronchitis in the past 3 weeks per her report.  She reports the fluid just has not gone away, she is not able to breathe, and today she had this fall because of it.  We discussed the importance of weight loss now, before she is wheelchair-bound.  We discussed diet as well.  Patient reports that she drinks a lot of V8, and I have advised her that that is high in sodium and contributing to her fluid retention.  Patient reports that she does not know how to diet, saying that she was thin all of her  life until a couple of years ago so she never had the diet before. (per DO)   Clinical Impression   Pt agreeable to OT and PT co-evaluation. Pt reports assist from daughter for bathing and dressing at baseline. Pt reports no use of RW for ambulation but described leaning on furniture. Today pt was able to stand and transfer to the chair with CGA without AD. Pt presents with L UE A/ROM deficit and general weakness. Pt reports desire to go home and is open to home health therapy services. Pt will benefit from continued OT in the hospital and recommended venue below to increase strength, balance, and endurance for safe ADL's.          If plan is discharge home, recommend the following: A little help with walking and/or transfers;A lot of help with bathing/dressing/bathroom;Assistance with cooking/housework;Assist for transportation;Help with stairs or ramp for entrance    Functional Status Assessment  Patient has had a recent decline in their functional status and demonstrates the ability to make significant improvements in function in a reasonable and predictable amount of time.  Equipment Recommendations  None recommended by OT           Precautions / Restrictions Precautions Precautions: Fall Restrictions Weight Bearing Restrictions: No      Mobility Bed Mobility  General bed mobility comments: Pt seated at the EOB when this therapist entered the room.    Transfers Overall transfer level: Needs assistance Equipment used: None Transfers: Sit to/from Stand, Bed to chair/wheelchair/BSC Sit to Stand: Contact guard assist     Step pivot transfers: Contact guard assist     General transfer comment: labored effort but no physical assist needed      Balance Overall balance assessment: Needs assistance Sitting-balance support: No upper extremity supported, Feet supported Sitting balance-Leahy Scale: Good Sitting balance - Comments: seated EOB   Standing  balance support: No upper extremity supported, During functional activity Standing balance-Leahy Scale: Fair Standing balance comment: without AD                           ADL either performed or assessed with clinical judgement   ADL Overall ADL's : Needs assistance/impaired     Grooming: Set up;Sitting   Upper Body Bathing: Set up;Minimal assistance;Sitting   Lower Body Bathing: Maximal assistance;Sitting/lateral leans   Upper Body Dressing : Set up;Minimal assistance;Sitting   Lower Body Dressing: Maximal assistance;Sitting/lateral leans   Toilet Transfer: Contact guard assist;Anterior/posterior;Stand-pivot Toilet Transfer Details (indicate cue type and reason): Simulated via transfer to chair from bed without AD Toileting- Clothing Manipulation and Hygiene: Set up;Sitting/lateral lean;Minimal assistance       Functional mobility during ADLs: Contact guard assist General ADL Comments: Able to take a couple steps away from the chair befor pivoting back to the chair.     Vision Baseline Vision/History: 1 Wears glasses Ability to See in Adequate Light: 1 Impaired Patient Visual Report: No change from baseline Vision Assessment?: No apparent visual deficits     Perception Perception: Not tested       Praxis Praxis: Not tested       Pertinent Vitals/Pain Pain Assessment Pain Assessment: Faces Faces Pain Scale: Hurts a little bit Pain Location: General Pain Descriptors / Indicators: Discomfort Pain Intervention(s): Limited activity within patient's tolerance, Monitored during session, Repositioned     Extremity/Trunk Assessment Upper Extremity Assessment Upper Extremity Assessment: Generalized weakness;LUE deficits/detail LUE Deficits / Details: Limited to ~75% available range for shoulder flexion. Reported as a baseline issue due to a biceps injury.   Lower Extremity Assessment Lower Extremity Assessment: Defer to PT evaluation   Cervical / Trunk  Assessment Cervical / Trunk Assessment: Normal   Communication Communication Communication: No apparent difficulties   Cognition Arousal: Alert Behavior During Therapy: WFL for tasks assessed/performed Overall Cognitive Status: Within Functional Limits for tasks assessed                                                        Home Living Family/patient expects to be discharged to:: Private residence Living Arrangements: Children Available Help at Discharge: Family Type of Home: House Home Access: Stairs to enter Secretary/administrator of Steps: 1 Entrance Stairs-Rails: None Home Layout: One level           Bathroom Accessibility: No   Home Equipment: None   Additional Comments: per chart review      Prior Functioning/Environment Prior Level of Function : Needs assist       Physical Assist : ADLs (physical)   ADLs (physical): Bathing;Dressing;IADLs Mobility Comments: Per pt, she does not use AD to ambulate  in the home due the the home being too small. Describes leaning on furniture. ADLs Comments: Assist for bathing and dressing from daughter.        OT Problem List: Obesity;Decreased strength;Decreased range of motion;Decreased activity tolerance;Impaired balance (sitting and/or standing);Cardiopulmonary status limiting activity      OT Treatment/Interventions: Self-care/ADL training;Therapeutic exercise;Therapeutic activities;Patient/family education;DME and/or AE instruction    OT Goals(Current goals can be found in the care plan section) Acute Rehab OT Goals Patient Stated Goal: return home OT Goal Formulation: With patient Time For Goal Achievement: 06/18/23 Potential to Achieve Goals: Good  OT Frequency: Min 1X/week    Co-evaluation PT/OT/SLP Co-Evaluation/Treatment: Yes Reason for Co-Treatment: To address functional/ADL transfers   OT goals addressed during session: ADL's and self-care                       End of  Session Equipment Utilized During Treatment: Oxygen  Activity Tolerance: Patient tolerated treatment well Patient left: in chair;with nursing/sitter in room  OT Visit Diagnosis: Unsteadiness on feet (R26.81);Other abnormalities of gait and mobility (R26.89);Muscle weakness (generalized) (M62.81)                Time: 1034-1050 OT Time Calculation (min): 16 min Charges:  OT General Charges $OT Visit: 1 Visit OT Evaluation $OT Eval Low Complexity: 1 Low  Isam Unrein OT, MOT  Danie Chandler 06/04/2023, 11:23 AM

## 2023-06-04 NOTE — TOC Initial Note (Signed)
Transition of Care [facility]) - Initial/Assessment Note    Patient Details  Name: Jennifer Odonnell MRN: [id number] Date of Birth: 1965-11-22  Transition of Care [facility]) CM/SW Contact:    Villa Herb, LCSWA Phone Number: 06/04/2023, 3:10 PM  Clinical Narrative:                 PT is recommending HH for pt at D/C. CSW spoke with pt to complete assessment. Pt states her and her 56 year old daughter live in the home. Pts daughter assists with ADLs as she can. Pt states her ex husband will take her to appointments if needed. Pt states she has not had HH in the past but is agreeable to this. CSW spoke to intake with [facility] in [place], [place] who states they can accept pts insurance. CSW will fax referral to contact provided at [phone]. Pt state she has a cane and walker to use outside the house if needed. TOC to follow.   Expected Discharge Plan: Home w Home Health Services Barriers to Discharge: Continued Medical Work up   Patient Goals and CMS Choice Patient states their goals for this hospitalization and ongoing recovery are:: return home CMS Medicare.gov Compare Post Acute Care list provided to:: Patient Choice offered to / list presented to : Patient      Expected Discharge Plan and Services In-house Referral: Clinical Social Work Discharge Planning Services: CM Consult Post Acute Care Choice: Home Health Living arrangements for the past 2 months: Single Family Home                           HH Arranged: PT, RN, OT HH Agency: [facility] Date HH Agency Contacted: 06/04/23   Representative spoke with at [facility] Agency: Misty Stanley  Prior Living Arrangements/Services Living arrangements for the past 2 months: Single Family Home Lives with:: Minor Children Patient language and need for interpreter reviewed:: Yes Do you feel safe going back to the place where you live?: Yes      Need for Family Participation in Patient Care: Yes (Comment) Care giver support system in place?: Yes  (comment) Current home services: DME Criminal Activity/Legal Involvement Pertinent to Current Situation/Hospitalization: No - Comment as needed  Activities of Daily Living   ADL Screening (condition at time of admission) Independently performs ADLs?: No Does the patient have a NEW difficulty with bathing/dressing/toileting/self-feeding that is expected to last >3 days?: No Does the patient have a NEW difficulty with getting in/out of bed, walking, or climbing stairs that is expected to last >3 days?: No Does the patient have a NEW difficulty with communication that is expected to last >3 days?: No Is the patient deaf or have difficulty hearing?: No Does the patient have difficulty seeing, even when wearing glasses/contacts?: No Does the patient have difficulty concentrating, remembering, or making decisions?: No  Permission Sought/Granted                  Emotional Assessment Appearance:: Appears stated age Attitude/Demeanor/Rapport: Engaged Affect (typically observed): Accepting Orientation: : Oriented to Self, Oriented to Place, Oriented to  Time, Oriented to Situation Alcohol / Substance Use: Not Applicable Psych Involvement: No (comment)  Admission diagnosis:  AKI (acute kidney injury) (HCC) [N17.9] Patient Active Problem List   Diagnosis Date Noted   AKI (acute kidney injury) (HCC) 06/04/2023   Acute on chronic respiratory failure with hypoxia (HCC) 06/04/2023   Fall at home, initial encounter 06/04/2023   COPD with acute  exacerbation (HCC) 09/07/2020   Multifocal pneumonia 09/07/2020   Acute respiratory failure with hypoxia (HCC) 09/07/2020   CREST syndrome (HCC) 09/07/2020   DM2 (diabetes mellitus, type 2) (HCC) 09/07/2020   HTN (hypertension) 09/07/2020   Mixed incontinence urge and stress 05/30/2015   PCP:  Aggie Cosier, MD Pharmacy:   [facility] - [place], [place] - [address] [place] [address] Phone: [phone] Fax:  [phone]     Social Determinants of Health (SDOH) Social History: SDOH Screenings   Food Insecurity: Food Insecurity Present (06/04/2023)  Housing: Low Risk  (06/04/2023)  Transportation Needs: No Transportation Needs (06/04/2023)  Utilities: Not At Risk (06/04/2023)  Tobacco Use: Medium Risk (06/03/2023)   SDOH Interventions:     Readmission Risk Interventions     No data to display

## 2023-06-04 NOTE — Plan of Care (Signed)
  Problem: Acute Rehab PT Goals(only PT should resolve) Goal: Pt Will Go Supine/Side To Sit Outcome: Progressing Flowsheets (Taken 06/04/2023 1226) Pt will go Supine/Side to Sit: with modified independence Goal: Patient Will Transfer Sit To/From Stand Outcome: Progressing Flowsheets (Taken 06/04/2023 1226) Patient will transfer sit to/from stand:  with supervision  with modified independence Goal: Pt Will Transfer Bed To Chair/Chair To Bed Outcome: Progressing Flowsheets (Taken 06/04/2023 1226) Pt will Transfer Bed to Chair/Chair to Bed:  with modified independence  with supervision Goal: Pt Will Ambulate Outcome: Progressing Flowsheets (Taken 06/04/2023 1226) Pt will Ambulate:  25 feet  with contact guard assist  with minimal assist  with rolling walker  with least restrictive assistive device   12:26 PM, 06/04/23 Ocie Bob, MPT Physical Therapist with [facility] 336 [phone] office [phone] mobile phone

## 2023-06-04 NOTE — ED Notes (Addendum)
Pt placed in bariatric bed, repositioned in bed, pt reuses hospital gown and wants to remain in personal clothes, Purewick placed due to strict in and outs, administration of Lasix, unable to tolerate bedside commode or bedpan

## 2023-06-04 NOTE — Plan of Care (Signed)
  Problem: Acute Rehab OT Goals (only OT should resolve) Goal: Pt. Will Perform Grooming Flowsheets (Taken 06/04/2023 1126) Pt Will Perform Grooming:  with modified independence  standing Goal: Pt. Will Perform Upper Body Dressing Flowsheets (Taken 06/04/2023 1126) Pt Will Perform Upper Body Dressing:  with modified independence  sitting Goal: Pt. Will Perform Lower Body Dressing Flowsheets (Taken 06/04/2023 1126) Pt Will Perform Lower Body Dressing:  with contact guard assist  with adaptive equipment  sitting/lateral leans Goal: Pt. Will Transfer To Toilet Flowsheets (Taken 06/04/2023 1126) Pt Will Transfer to Toilet:  with modified independence  ambulating Goal: Pt. Will Perform Toileting-Clothing Manipulation Flowsheets (Taken 06/04/2023 1126) Pt Will Perform Toileting - Clothing Manipulation and hygiene:  with modified independence  sitting/lateral leans Goal: Pt/Caregiver Will Perform Home Exercise Program Flowsheets (Taken 06/04/2023 1126) Pt/caregiver will Perform Home Exercise Program:  Increased ROM  Increased strength  Right Upper extremity  Left upper extremity  Independently  Vanetta Rule OT, MOT

## 2023-06-04 NOTE — Plan of Care (Signed)
Patient was seen and examined at bedside.  Patient was admitted last night status post fall.  Complaining of bilateral knee pain, left greater than right.  Still has lower extremity edema.  Denied any other complaints.  Patient was having spasms for which she takes Soma at home so home medications has been resumed. We will continue current treatment, PT and OT consulted, patient will need home physical therapy versus SNF placement. Follow TOC for discharge planning

## 2023-06-04 NOTE — Evaluation (Signed)
Physical Therapy Evaluation Patient Details Name: Jennifer Odonnell MRN: [id number] DOB: 1966/04/03 Today's Date: 06/04/2023  History of Present Illness  Jennifer Odonnell is a 57 y.o. female with medical history significant of arthritis, cancer, COPD, crest syndrome, diabetes mellitus type 2, hypertension, neuropathy, chronic respiratory failure on 8 L nasal cannula at home secondary to obesity hypoventilation syndrome/COPD, presents to the ED with a chief complaint of fall.  Patient reports that she has been progressively more short of breath over the past 3 weeks.  Her shortness of breath is worse with exertion.  She has no chest pain, no palpitations.  Today she was trying to walk across the room when she was so short of breath that her legs gave out and she fell onto her knees.  She did not hit her head and she did not have loss of consciousness.  Patient reports that she ambulates less and less these days due to pain in her knees and her back as well as shortness of breath.  She is concerned that her "fluid flares" are coming back more and more often.  Patient reports that per her PCP she is supposed to take 20 mg of Lasix daily, when she starts to have a flare she should take 20 mg of Lasix twice daily.  If that does not help after couple of days she supposed to increase to 40 mg of Lasix twice daily.  Patient reports that she did that whole regimen 3 weeks ago.  She was also on an antibiotic at some point for bronchitis in the past 3 weeks per her report.  She reports the fluid just has not gone away, she is not able to breathe, and today she had this fall because of it.  We discussed the importance of weight loss now, before she is wheelchair-bound.  We discussed diet as well.  Patient reports that she drinks a lot of V8, and I have advised her that that is high in sodium and contributing to her fluid retention.  Patient reports that she does not know how to diet, saying that she was thin all of her life  until a couple of years ago so she never had the diet before.   Clinical Impression  Patient present seated at EOB, apprehensive to get up due to c/o pain in feet and feeling "exhausted" per patient.  Patient required much encouragement and eventually able to stand at bedside leaning on armrest of chair and tolerated taking a few steps before having to sit due to c/o fatigue.  Patient tolerated sitting up in chair after therapy - nurse aware.  Patient will benefit from continued skilled physical therapy in hospital and recommended venue below to increase strength, balance, endurance for safe ADLs and gait.        If plan is discharge home, recommend the following: A little help with walking and/or transfers;A little help with bathing/dressing/bathroom;Help with stairs or ramp for entrance;Assistance with cooking/housework   Can travel by private vehicle        Equipment Recommendations None recommended by PT  Recommendations for Other Services       Functional Status Assessment Patient has had a recent decline in their functional status and demonstrates the ability to make significant improvements in function in a reasonable and predictable amount of time.     Precautions / Restrictions Precautions Precautions: Fall Restrictions Weight Bearing Restrictions: No      Mobility  Bed Mobility  General bed mobility comments: Presents seated at bedside and declined bed mobility due no wanting to increase back pain    Transfers Overall transfer level: Needs assistance Equipment used: None, 1 person hand held assist Transfers: Sit to/from Stand, Bed to chair/wheelchair/BSC Sit to Stand: Contact guard assist   Step pivot transfers: Contact guard assist       General transfer comment: increased time, labored movement    Ambulation/Gait Ambulation/Gait assistance: Contact guard assist Gait Distance (Feet): 3 Feet Assistive device: None, 1 person hand held  assist Gait Pattern/deviations: Decreased step length - right, Decreased step length - left, Decreased stride length Gait velocity: slow     General Gait Details: limited to a few steps at bedside leaning on armrest of chair before having to sit due to c/o fatigue, pain in feet  Stairs            Wheelchair Mobility     Tilt Bed    Modified Rankin (Stroke Patients Only)       Balance Overall balance assessment: Needs assistance Sitting-balance support: Feet supported, No upper extremity supported Sitting balance-Leahy Scale: Good Sitting balance - Comments: seated EOB   Standing balance support: No upper extremity supported, During functional activity Standing balance-Leahy Scale: Fair Standing balance comment: without AD                             Pertinent Vitals/Pain Pain Assessment Pain Assessment: Faces Faces Pain Scale: Hurts a little bit Pain Location: General Pain Descriptors / Indicators: Discomfort Pain Intervention(s): Limited activity within patient's tolerance, Monitored during session, Repositioned    Home Living Family/patient expects to be discharged to:: Private residence Living Arrangements: Children Available Help at Discharge: Family Type of Home: House Home Access: Stairs to enter Entrance Stairs-Rails: None Entrance Stairs-Number of Steps: 1   Home Layout: One level Home Equipment: Agricultural consultant (2 wheels);Cane - single point;Wheelchair - manual Additional Comments: per chart review    Prior Function Prior Level of Function : Needs assist       Physical Assist : ADLs (physical);Mobility (physical) Mobility (physical): Bed mobility;Transfers;Gait;Stairs ADLs (physical): Bathing;Dressing;IADLs Mobility Comments: Per pt, she does not use AD to ambulate in the home due the the home being too small. Describes leaning on furniture. ADLs Comments: Assist for bathing and dressing from daughter.     Extremity/Trunk  Assessment   Upper Extremity Assessment Upper Extremity Assessment: Defer to OT evaluation LUE Deficits / Details: Limited to ~75% available range for shoulder flexion. Reported as a baseline issue due to a biceps injury.    Lower Extremity Assessment Lower Extremity Assessment: Generalized weakness    Cervical / Trunk Assessment Cervical / Trunk Assessment: Normal  Communication   Communication Communication: No apparent difficulties  Cognition Arousal: Alert Behavior During Therapy: WFL for tasks assessed/performed, Anxious, Agitated Overall Cognitive Status: Within Functional Limits for tasks assessed                                          General Comments      Exercises     Assessment/Plan    PT Assessment Patient needs continued PT services  PT Problem List Decreased strength;Decreased activity tolerance;Decreased balance;Decreased mobility       PT Treatment Interventions DME instruction;Gait training;Stair training;Functional mobility training;Therapeutic activities;Therapeutic exercise;Balance training;Patient/family education    PT Goals (  Current goals can be found in the Care Plan section)  Acute Rehab PT Goals Patient Stated Goal: return home with family to assist PT Goal Formulation: With patient Time For Goal Achievement: 06/11/23 Potential to Achieve Goals: Good    Frequency Min 3X/week     Co-evaluation PT/OT/SLP Co-Evaluation/Treatment: Yes Reason for Co-Treatment: To address functional/ADL transfers PT goals addressed during session: Mobility/safety with mobility;Balance;Proper use of DME OT goals addressed during session: ADL's and self-care       AM-PAC PT "6 Clicks" Mobility  Outcome Measure Help needed turning from your back to your side while in a flat bed without using bedrails?: A Little Help needed moving from lying on your back to sitting on the side of a flat bed without using bedrails?: A Little Help needed  moving to and from a bed to a chair (including a wheelchair)?: A Little Help needed standing up from a chair using your arms (e.g., wheelchair or bedside chair)?: A Little Help needed to walk in hospital room?: A Lot Help needed climbing 3-5 steps with a railing? : A Lot 6 Click Score: 16    End of Session Equipment Utilized During Treatment: Oxygen Activity Tolerance: Patient tolerated treatment well;Patient limited by fatigue Patient left: in chair;with call bell/phone within reach Nurse Communication: Mobility status PT Visit Diagnosis: Unsteadiness on feet (R26.81);Other abnormalities of gait and mobility (R26.89);Muscle weakness (generalized) (M62.81)    Time: 1610-9604 PT Time Calculation (min) (ACUTE ONLY): 30 min   Charges:   PT Evaluation $PT Eval Moderate Complexity: 1 Mod PT Treatments $Therapeutic Activity: 23-37 mins PT General Charges $$ ACUTE PT VISIT: 1 Visit         12:24 PM, 06/04/23 Ocie Bob, MPT Physical Therapist with [facility] [phone] office [phone] mobile phone

## 2023-06-04 NOTE — ED Notes (Signed)
Call placed to portable equipment to request bariatric bed

## 2023-06-04 NOTE — Assessment & Plan Note (Signed)
-   Blood pressure 134/65-153/69 in the ED - Holding losartan and hydrochlorothiazide in the setting of AKI - Continue atenolol - Continue to monitor

## 2023-06-04 NOTE — Progress Notes (Signed)
*  PRELIMINARY RESULTS* Echocardiogram 2D Echocardiogram has not been performed. Patient refused echo said she just had an echo in [place], [place].  Stacey Drain 06/04/2023, 3:29 PM

## 2023-06-04 NOTE — ED Notes (Signed)
ED TO INPATIENT HANDOFF REPORT  ED Nurse Name and Phone #:   S Name/Age/Gender Jennifer Odonnell 57 y.o. female Room/Bed: APA16A/APA16A  Code Status   Code Status: Prior  Home/SNF/Other Home Patient oriented to: self, place, time, and situation Is this baseline? Yes   Triage Complete: Triage complete  Chief Complaint AKI (acute kidney injury) (HCC) [N17.9]  Triage Note Pt presents with fall from this am, fell from sitting to standing position, tripping over oxygen tubing, hitting left knee and head, denies LOC, pt has c/o of increased sleepiness over past several days, chronic on 8 liters, COPD.   Allergies Allergies  Allergen Reactions   Daucus Carota Swelling    Raw carrots   Lisinopril Anaphylaxis   Tiotropium Swelling   Erythromycin Diarrhea   Ambien [Zolpidem] Other (See Comments)    Woke up disoriented and scared.   Spiriva Handihaler [Tiotropium Bromide Monohydrate]     Level of Care/Admitting Diagnosis ED Disposition     ED Disposition  Admit   Condition  --   Comment  Hospital Area: [facility] [[id number]]  Level of Care: Telemetry [5]  Covid Evaluation: Symptomatic Person Under Investigation (PUI) or recent exposure (last 10 days) *Testing Required*  Diagnosis: AKI (acute kidney injury) [facility]) [[id number]]  Admitting Physician: Lilyan Gilford [[id number]]  Attending Physician: Lilyan Gilford [[id number]]  Certification:: I certify this patient will need inpatient services for at least 2 midnights  Expected Medical Readiness: 06/06/2023          B Medical/Surgery History Past Medical History:  Diagnosis Date   Arthritis    Cancer (HCC)    cervical cancer at age 58   COPD (chronic obstructive pulmonary disease) (HCC)    CREST syndrome (HCC)    Degenerative disorder of bone    Diabetes mellitus without complication (HCC)    Hypertension    Neuropathy    Requires continuous at home supplemental oxygen    Past Surgical History:   Procedure Laterality Date   ABDOMINAL HYSTERECTOMY     BLADDER EXTROPHY RECONSTRUCTION PELVIC SAGITTAL OSTEOTOMY  2020   KNEE ARTHROSCOPY WITH PATELLA RECONSTRUCTION Bilateral    PARTIAL HYSTERECTOMY  07/2014   TONSILLECTOMY       A IV Location/Drains/Wounds Patient Lines/Drains/Airways Status     Active Line/Drains/Airways     Name Placement date Placement time Site Days   Peripheral IV 06/03/23 22 G 1" Right Hand 06/03/23  2349  Hand  1   External Urinary Catheter 06/04/23  0126  --  less than 1            Intake/Output Last 24 hours No intake or output data in the 24 hours ending 06/04/23 0218  Labs/Imaging Results for orders placed or performed during the hospital encounter of 06/03/23 (from the past 48 hour(s))  CBC with Differential     Status: Abnormal   Collection Time: 06/03/23 10:34 PM  Result Value Ref Range   WBC 10.1 4.0 - 10.5 K/uL   RBC 3.16 (L) 3.87 - 5.11 MIL/uL   Hemoglobin 9.8 (L) 12.0 - 15.0 g/dL   HCT 57.8 (L) 46.9 - 62.9 %   MCV 103.2 (H) 80.0 - 100.0 fL   MCH 31.0 26.0 - 34.0 pg   MCHC 30.1 30.0 - 36.0 g/dL   RDW 52.8 41.3 - 24.4 %   Platelets 190 150 - 400 K/uL   nRBC 0.6 (H) 0.0 - 0.2 %   Neutrophils Relative % 76 %   Neutro Abs  7.7 1.7 - 7.7 K/uL   Lymphocytes Relative 14 %   Lymphs Abs 1.4 0.7 - 4.0 K/uL   Monocytes Relative 6 %   Monocytes Absolute 0.6 0.1 - 1.0 K/uL   Eosinophils Relative 3 %   Eosinophils Absolute 0.3 0.0 - 0.5 K/uL   Basophils Relative 0 %   Basophils Absolute 0.0 0.0 - 0.1 K/uL   Immature Granulocytes 1 %   Abs Immature Granulocytes 0.11 (H) 0.00 - 0.07 K/uL    Comment: Performed at [facility], [address] [place], [place] [address]  Comprehensive metabolic panel     Status: Abnormal   Collection Time: 06/03/23 10:34 PM  Result Value Ref Range   Sodium 136 135 - 145 mmol/L   Potassium 4.7 3.5 - 5.1 mmol/L   Chloride 86 (L) 98 - 111 mmol/L   CO2 37 (H) 22 - 32 mmol/L   Glucose, Bld 177 (H) 70 - 99  mg/dL    Comment: Glucose reference range applies only to samples taken after fasting for at least 8 hours.   BUN 21 (H) 6 - 20 mg/dL   Creatinine, Ser 9.56 (H) 0.44 - 1.00 mg/dL   Calcium 9.3 8.9 - 21.3 mg/dL   Total Protein 6.8 6.5 - 8.1 g/dL   Albumin 3.7 3.5 - 5.0 g/dL   AST 43 (H) 15 - 41 U/L   ALT 32 0 - 44 U/L   Alkaline Phosphatase 59 38 - 126 U/L   Total Bilirubin 0.4 0.3 - 1.2 mg/dL   GFR, Estimated 45 (L) >60 mL/min    Comment: (NOTE) Calculated using the CKD-EPI Creatinine Equation (2021)    Anion gap 13 5 - 15    Comment: Performed at [facility], [address], [place], [place] [address]  Blood gas, venous (at [facility])     Status: Abnormal   Collection Time: 06/03/23 10:34 PM  Result Value Ref Range   pH, Ven 7.43 7.25 - 7.43   pCO2, Ven 79 (HH) 44 - 60 mmHg    Comment: CRITICAL RESULT CALLED TO, READ BACK BY AND VERIFIED WITH: GREENWOOD,B ON 06/03/23 AT 2300 BY LOY,C    pO2, Ven 55 (H) 32 - 45 mmHg   Bicarbonate 52.4 (H) 20.0 - 28.0 mmol/L   Acid-Base Excess 24.2 (H) 0.0 - 2.0 mmol/L   O2 Saturation 89.8 %   Patient temperature 36.9    Collection site RIGHT ANTECUBITAL    Drawn by [phone]     Comment: Performed at [facility], [address] [place], [place] [address]  Troponin I (High Sensitivity)     Status: None   Collection Time: 06/03/23 10:34 PM  Result Value Ref Range   Troponin I (High Sensitivity) 12 <18 ng/L    Comment: Performed at [facility], [address], [place], [place] [address]  Brain natriuretic peptide     Status: None   Collection Time: 06/03/23 10:34 PM  Result Value Ref Range   B Natriuretic Peptide 30.0 0.0 - 100.0 pg/mL    Comment: Performed at [facility], [address] [place], [place] [address]  Urinalysis, Routine w reflex microscopic -Urine, Clean Catch     Status: Abnormal   Collection Time: 06/03/23 11:35 PM  Result Value Ref Range   Color, Urine YELLOW YELLOW   APPearance HAZY (A) CLEAR   Specific Gravity, Urine  1.016 1.005 - 1.030   pH 7.0 5.0 - 8.0   Glucose, UA NEGATIVE NEGATIVE mg/dL  Hgb urine dipstick NEGATIVE NEGATIVE   Bilirubin Urine NEGATIVE NEGATIVE   Ketones, ur NEGATIVE NEGATIVE mg/dL   Protein, ur 30 (A) NEGATIVE mg/dL   Nitrite NEGATIVE NEGATIVE   Leukocytes,Ua NEGATIVE NEGATIVE   RBC / HPF 0-5 0 - 5 RBC/hpf   WBC, UA 0-5 0 - 5 WBC/hpf   Bacteria, UA RARE (A) NONE SEEN   Squamous Epithelial / HPF 6-10 0 - 5 /HPF   Mucus PRESENT     Comment: Performed at [facility], [address] [place], [place] [address]   DG Knee Complete 4 Views Right  Result Date: 06/03/2023 CLINICAL DATA:  Recent fall with right knee pain, initial encounter EXAM: RIGHT KNEE - COMPLETE 4+ VIEW COMPARISON:  None Available. FINDINGS: Postsurgical changes are noted in the proximal tibia. No acute fracture or dislocation is noted. Tricompartmental degenerative changes are noted. IMPRESSION: Degenerative change without acute abnormality Electronically Signed   By: Alcide Clever M.D.   On: 06/03/2023 23:04   DG Chest Portable 1 View  Result Date: 06/03/2023 CLINICAL DATA:  Recent fall with chest pain, initial EXAM: PORTABLE CHEST 1 VIEW COMPARISON:  01/28/2023 FINDINGS: The heart size and mediastinal contours are within normal limits. Both lungs are clear. The visualized skeletal structures are unremarkable. IMPRESSION: No active disease. Electronically Signed   By: Alcide Clever M.D.   On: 06/03/2023 22:59   DG Knee Complete 4 Views Left  Result Date: 06/03/2023 CLINICAL DATA:  Fall and trauma to the left knee.  Pain. EXAM: LEFT KNEE - COMPLETE 4+ VIEW COMPARISON:  None Available. FINDINGS: There is no acute fracture or dislocation. Moderate arthritic changes of the knee with tricompartmental narrowing and spurring. No significant joint effusion. Fixation screws of the anterior proximal tibia. The soft tissues are grossly unremarkable. IMPRESSION: 1. No acute fracture or dislocation. 2. Moderate arthritic changes.  Electronically Signed   By: Elgie Collard M.D.   On: 06/03/2023 22:55    Pending Labs Unresulted Labs (From admission, onward)     Start     Ordered   06/04/23 0127  SARS Coronavirus 2 by RT PCR (hospital order, performed in [facility] hospital lab) *cepheid single result test* Anterior Nasal Swab  (Tier 2 - SARS Coronavirus 2 by RT PCR (hospital order, performed in [facility] hospital lab) *cepheid single result test*)  Once,   R        06/04/23 0127            Vitals/Pain Today's Vitals   06/04/23 0138 06/04/23 0139 06/04/23 0145 06/04/23 0151  BP: (!) 119/107  (!) 129/98   Pulse: 85  87   Resp: 19  (!) 29   Temp:    98.3 F (36.8 C)  TempSrc:    Oral  SpO2: 96%  96%   Weight:      Height:      PainSc:  6       Isolation Precautions Airborne and Contact precautions  Medications Medications  ondansetron (ZOFRAN) injection 4 mg (4 mg Intravenous Given 06/03/23 2350)  morphine (PF) 4 MG/ML injection 4 mg (4 mg Intravenous Given 06/03/23 2350)  furosemide (LASIX) injection 40 mg (40 mg Intravenous Given 06/03/23 2351)    Mobility walks with person assist     Focused Assessments    R Recommendations: See Admitting Provider Note  Report given to:   Additional Notes:

## 2023-06-04 NOTE — Assessment & Plan Note (Signed)
-   Creatinine 1.39 - This is up from 0.83 - Avoid nephrotoxic agents when possible - Likely cardiorenal syndrome with signs of fluid overload - Continue diuresis -Trend in the a.m.

## 2023-06-04 NOTE — Assessment & Plan Note (Signed)
-   Patient uses 8 L nasal cannula at baseline - She reports with any exertion her oxygen sats have been dropping to the 70s and she has been having to turn her oxygen up to 10 L - She is COVID-negative - Chest x-ray shows no acute findings - Counseled on importance of weight loss to address her obesity hypoventilation syndrome - COPD means she has low reserve to begin with, but does not seem to be in exacerbation at this time - Continue albuterol and Pulmicort - Patient is clinically fluid overloaded - Diuresis started in the ED, continue diuresis - No echo on file, get an echo in the a.m. to evaluate for CHF - Continue to monitor

## 2023-06-04 NOTE — Assessment & Plan Note (Addendum)
-   Multifactorial with pain medication, but obesity, acute on chronic respiratory failure all contributing - Hold Soma - Counseled on diet, patient reports she does not know how to diet.  Consult dietitian for diet education - Treat COPD and CHF - PT eval and treat

## 2023-06-04 NOTE — Assessment & Plan Note (Signed)
-   Patient takes 44 units of basal insulin at baseline - Continue reduced dose while in the hospital - Sliding scale coverage - Monitor CBGs

## 2023-06-04 NOTE — H&P (Signed)
History and Physical    Patient: Jennifer Odonnell NFA:[id number] DOB: 12/17/1965 DOA: 06/03/2023 DOS: the patient was seen and examined on 06/04/2023 PCP: Aggie Cosier, MD  Patient coming from: Home  Chief Complaint:  Chief Complaint  Patient presents with   Fall   HPI: Jennifer Odonnell is a 57 y.o. female with medical history significant of arthritis, cancer, COPD, crest syndrome, diabetes mellitus type 2, hypertension, neuropathy, chronic respiratory failure on 8 L nasal cannula at home secondary to obesity hypoventilation syndrome/COPD, presents to the ED with a chief complaint of fall.  Patient reports that she has been progressively more short of breath over the past 3 weeks.  Her shortness of breath is worse with exertion.  She has no chest pain, no palpitations.  Today she was trying to walk across the room when she was so short of breath that her legs gave out and she fell onto her knees.  She did not hit her head and she did not have loss of consciousness.  Patient reports that she ambulates less and less these days due to pain in her knees and her back as well as shortness of breath.  She is concerned that her "fluid flares" are coming back more and more often.  Patient reports that per her PCP she is supposed to take 20 mg of Lasix daily, when she starts to have a flare she should take 20 mg of Lasix twice daily.  If that does not help after couple of days she supposed to increase to 40 mg of Lasix twice daily.  Patient reports that she did that whole regimen 3 weeks ago.  She was also on an antibiotic at some point for bronchitis in the past 3 weeks per her report.  She reports the fluid just has not gone away, she is not able to breathe, and today she had this fall because of it.  We discussed the importance of weight loss now, before she is wheelchair-bound.  We discussed diet as well.  Patient reports that she drinks a lot of V8, and I have advised her that that is high in sodium and  contributing to her fluid retention.  Patient reports that she does not know how to diet, saying that she was thin all of her life until a couple of years ago so she never had the diet before.  She is agreeable to talk to nutritionist about diet education.  Patient reports when she takes Lasix at home she is responsive to it.  Although she does not admit it, I get the impression that she skips some doses because she says she cannot do anything on the days she takes Lasix because she is peeing all day.  Patient denies any wheezing.  She reports she has been turning up her oxygen to 10 L, with ambulation at home.  She still drops down to the 70s per her report.  She also reports orthopnea, and even needs to sit up on the edge of the hospital bed during my exam as it is uncomfortable for her to lay flat.  Patient has no other complaints at this time aside from chronic pain complaints of her knees and back.  Patient does not smoke, does not drink.  Patient is full code. Review of Systems: As mentioned in the history of present illness. All other systems reviewed and are negative. Past Medical History:  Diagnosis Date   Arthritis    Cancer (HCC)    cervical cancer at age 52  COPD (chronic obstructive pulmonary disease) (HCC)    CREST syndrome (HCC)    Degenerative disorder of bone    Diabetes mellitus without complication (HCC)    Hypertension    Neuropathy    Requires continuous at home supplemental oxygen    Past Surgical History:  Procedure Laterality Date   ABDOMINAL HYSTERECTOMY     BLADDER EXTROPHY RECONSTRUCTION PELVIC SAGITTAL OSTEOTOMY  2020   KNEE ARTHROSCOPY WITH PATELLA RECONSTRUCTION Bilateral    PARTIAL HYSTERECTOMY  07/2014   TONSILLECTOMY     Social History:  reports that she has quit smoking. Her smoking use included cigarettes. She has a 30 pack-year smoking history. She has never used smokeless tobacco. She reports that she does not currently use alcohol. She reports that she  does not currently use drugs.  Allergies  Allergen Reactions   Daucus Carota Swelling    Raw carrots   Lisinopril Anaphylaxis   Tiotropium Swelling   Erythromycin Diarrhea   Ambien [Zolpidem] Other (See Comments)    Woke up disoriented and scared.   Spiriva Handihaler [Tiotropium Bromide Monohydrate]     Family History  Problem Relation Age of Onset   Asthma Mother    Emphysema Mother    Arthritis Mother    Hyperlipidemia Mother    Diabetes Mother    Hypertension Mother    Heart disease Father    Heart attack Father    Cancer Father        throat   Hyperlipidemia Father    Alcohol abuse Father    Arthritis Father    Asthma Sister    Emphysema Sister    Seizures Sister    Hypertension Sister    Arthritis Brother    Hypertension Brother    Thyroid disease Daughter     Prior to Admission medications   Medication Sig Start Date End Date Taking? Authorizing Provider  albuterol (PROVENTIL HFA;VENTOLIN HFA) 108 (90 BASE) MCG/ACT inhaler Inhale 2 puffs into the lungs every 6 (six) hours as needed for wheezing or shortness of breath.    [provider]  albuterol (PROVENTIL) (2.5 MG/3ML) 0.083% nebulizer solution Take 3 mLs (2.5 mg total) by nebulization every 4 (four) hours as needed for wheezing or shortness of breath. 09/11/20   Johnson, [name], MD  ascorbic acid (VITAMIN C) 500 MG tablet Take 500 mg by mouth daily.    [provider]  aspirin 81 MG EC tablet Take 81 mg by mouth daily.    [provider]  atenolol (TENORMIN) 25 MG tablet Take 25 mg by mouth daily. 08/14/20   [provider]  budesonide (PULMICORT) 0.5 MG/2ML nebulizer solution Take 0.5 mg by nebulization 2 (two) times daily. 08/14/20   [provider]  carisoprodol (SOMA) 350 MG tablet Take 350 mg by mouth 3 (three) times daily. 08/04/20   [provider]  colchicine 0.6 MG tablet Take 0.6 mg by mouth daily. 09/04/20   [provider]  diclofenac  Sodium (VOLTAREN) 1 % GEL Apply 1 g topically 4 (four) times daily. 08/14/20   [provider]  docusate sodium (COLACE) 100 MG capsule Take 100 mg by mouth 2 (two) times daily.    [provider]  fenofibrate (TRICOR) 48 MG tablet Take 48 mg by mouth daily. 08/14/20   [provider]  FEROSUL 325 (65 Fe) MG tablet Take 325 mg by mouth daily. 08/15/20   [provider]  folic acid (FOLVITE) 1 MG tablet Take 1 mg by  mouth daily. 08/14/20   [provider]  glipiZIDE (GLUCOTROL XL) 2.5 MG 24 hr tablet Take 2.5 mg by mouth every morning. 08/14/20   [provider]  guaiFENesin-codeine 100-10 MG/5ML syrup Take 10 mLs by mouth every 6 (six) hours as needed for cough. 09/11/20   Johnson, [name], MD  hydroxychloroquine (PLAQUENIL) 200 MG tablet Take 400 mg by mouth daily. 08/14/20   [provider]  ipratropium-albuterol (DUONEB) 0.5-2.5 (3) MG/3ML SOLN Take 3 mLs by nebulization 3 (three) times daily. 08/14/20   [provider]  LANTUS SOLOSTAR 100 UNIT/ML Solostar Pen Inject 44 Units into the skin 2 (two) times daily. 09/04/20   [provider]  losartan-hydrochlorothiazide (HYZAAR) 100-12.5 MG tablet Take 1 tablet by mouth daily. 08/14/20   [provider]  metFORMIN (GLUCOPHAGE) 1000 MG tablet Take 1,000 mg by mouth 2 (two) times daily with a meal.    [provider]  Multiple Vitamins-Calcium (ONE-A-DAY WOMENS FORMULA) TABS Take 1 tablet by mouth daily.    [provider]  nystatin ointment (MYCOSTATIN) Apply 1 application topically 2 (two) times daily.    [provider]  omeprazole (PRILOSEC) 40 MG capsule Take 40 mg by mouth daily. 08/15/20   [provider]  Oxycodone HCl 10 MG TABS Take 10 mg by mouth 4 (four) times daily. 08/25/20   [provider]  oxyCODONE-acetaminophen (PERCOCET) 5-325 MG tablet Take 1 tablet by mouth every 6 (six) hours as needed for  moderate pain or severe pain. 06/23/22   Fayrene Helper, PA-C  predniSONE (DELTASONE) 20 MG tablet Take 3 PO QAM x5days, 2 PO QAM x5days, 1 PO QAM x5days, then resume 10 mg QD 09/12/20   Johnson, [name], MD  PREMARIN 0.625 MG tablet Take 0.625 mg by mouth daily. 08/14/20   [provider]  promethazine (PHENERGAN) 25 MG tablet Take 25 mg by mouth every 12 (twelve) hours.    [provider]  simvastatin (ZOCOR) 40 MG tablet Take 40 mg by mouth every evening. 08/14/20   [provider]  sitaGLIPtin (JANUVIA) 100 MG tablet Take 100 mg by mouth daily.    [provider]  TRULICITY 0.75 MG/0.5ML SOPN Inject 0.75 mg into the skin once a week. 09/04/20   [provider]  Vitamin D, Ergocalciferol, (DRISDOL) 1.25 MG (50000 UNIT) CAPS capsule Take 50,000 Units by mouth once a week. 08/14/20   [provider]    Physical Exam: Vitals:   06/04/23 0145 06/04/23 0151 06/04/23 0257 06/04/23 0325  BP: (!) 129/98  95/64   Pulse: 87  89   Resp: (!) 29  17   Temp:  98.3 F (36.8 C) 97.9 F (36.6 C)   TempSrc:  Oral    SpO2: 96%  99%   Weight:    (!) 162.8 kg  Height:       1.  General: Patient sitting on edge of hospital bed,  no acute distress   2. Psychiatric: Alert and oriented x 3, tearful when talking about weight   3. Neurologic: Speech and language are normal, face is symmetric, moves all 4 extremities voluntarily, at baseline without acute deficits on limited exam   4. HEENMT:  Head is atraumatic, normocephalic, pupils reactive to light, neck is supple, trachea is midline, mucous membranes are moist   5. Respiratory : On auscultation there is crackles on the right greater than left without wheezing, rhonchi, no cyanosis, no increase in work of breathing or accessory muscle use,  speaking in full sentences   6. Cardiovascular : Heart rate normal, rhythm is regular, no murmurs, rubs or gallops, peripheral edema with some fluid-filled  vesicles on her right leg, peripheral pulses palpated   7. Gastrointestinal:  Abdomen is soft, nondistended, nontender to palpation bowel sounds active, no masses or organomegaly palpated   8. Skin:  Skin is warm, dry and intact without rashes, acute lesions, or ulcers on limited exam, venous insufficiency changes over lower leg more on the right than the left   9.Musculoskeletal:  No acute deformities or trauma, no asymmetry in tone, peripheral edema, peripheral pulses palpated, no tenderness to palpation in the extremities  Data Reviewed: In the ED Temp 98.4, heart rate 82-85, respiratory rate 18-24, blood pressure 134/65-153/69, satting 96/99% on 8 L nasal cannula pH 7.43, pCO2 79 No leukocytosis at 10.1, hemoglobin 9.8 Procalcitonin undetectable Chemistry shows an AKI with a creatinine 1.39 UA is not indicative of UTI Chest x-ray is without acute changes Troponin is 12 BNP is 30 Left knee x-ray is without acute changes Right knee x-ray is without acute changes  EKG shows a heart rate of 81, sinus rhythm, QTc 422 Patient was given Lasix, morphine, Zofran in the ED Admission was requested for fluid overload  Assessment and Plan: * AKI (acute kidney injury) (HCC) - Creatinine 1.39 - This is up from 0.83 - Avoid nephrotoxic agents when possible - Likely cardiorenal syndrome with signs of fluid overload - Continue diuresis -Trend in the a.m.  Fall at home, initial encounter - Multifactorial with pain medication, but obesity, acute on chronic respiratory failure all contributing - Hold Soma - Counseled on diet, patient reports she does not know how to diet.  Consult dietitian for diet education - Treat COPD and CHF - PT eval and treat  Acute on chronic respiratory failure with hypoxia (HCC) - Patient uses 8 L nasal cannula at baseline - She reports with any exertion her oxygen sats have been dropping to the 70s and she has been having to turn her oxygen up to 10 L - She  is COVID-negative - Chest x-ray shows no acute findings - Counseled on importance of weight loss to address her obesity hypoventilation syndrome - COPD means she has low reserve to begin with, but does not seem to be in exacerbation at this time - Continue albuterol and Pulmicort - Patient is clinically fluid overloaded - Diuresis started in the ED, continue diuresis - No echo on file, get an echo in the a.m. to evaluate for CHF - Continue to monitor  HTN (hypertension) - Blood pressure 134/65-153/69 in the ED - Holding losartan and hydrochlorothiazide in the setting of AKI - Continue atenolol - Continue to monitor  DM2 (diabetes mellitus, type 2) (HCC) - Patient takes 44 units of basal insulin at baseline - Continue reduced dose while in the hospital - Sliding scale coverage - Monitor CBGs  CREST syndrome (HCC) - Continue Plaquenil  COPD with acute exacerbation (HCC) - Continue Pulmicort, albuterol every 6 hours - Patient has an allergy to DuoNeb - Chest x-ray shows no acute findings - VBG shows chronic retention, but does not seem to be an exacerbation of her COPD at this time      Advance Care Planning:   Code Status: Full Code  Consults: None at this time  Family Communication: No family at bedside  Severity of Illness: The appropriate patient status for this patient is INPATIENT. Inpatient status is judged to be reasonable and necessary in  order to provide the required intensity of service to ensure the patient's safety. The patient's presenting symptoms, physical exam findings, and initial radiographic and laboratory data in the context of their chronic comorbidities is felt to place them at high risk for further clinical deterioration. Furthermore, it is not anticipated that the patient will be medically stable for discharge from the hospital within 2 midnights of admission.   * I certify that at the point of admission it is my clinical judgment that the patient will  require inpatient hospital care spanning beyond 2 midnights from the point of admission due to high intensity of service, high risk for further deterioration and high frequency of surveillance required.*  Author: Lilyan Gilford, DO 06/04/2023 5:29 AM  For on call review [email or web address].

## 2023-06-04 NOTE — Plan of Care (Signed)
  Problem: Education: Goal: Knowledge of General Education information will improve Description Including pain rating scale, medication(s)/side effects and non-pharmacologic comfort measures Outcome: Progressing   Problem: Health Behavior/Discharge Planning: Goal: Ability to manage health-related needs will improve Outcome: Progressing   

## 2023-06-04 NOTE — Assessment & Plan Note (Signed)
-   Continue Plaquenil 

## 2023-06-04 NOTE — Assessment & Plan Note (Signed)
-   Continue Pulmicort, albuterol every 6 hours - Patient has an allergy to DuoNeb - Chest x-ray shows no acute findings - VBG shows chronic retention, but does not seem to be an exacerbation of her COPD at this time

## 2023-06-05 DIAGNOSIS — J9602 Acute respiratory failure with hypercapnia: Secondary | ICD-10-CM

## 2023-06-05 DIAGNOSIS — J9601 Acute respiratory failure with hypoxia: Secondary | ICD-10-CM

## 2023-06-05 LAB — BASIC METABOLIC PANEL
Anion gap: 12 (ref 5–15)
BUN: 21 mg/dL — ABNORMAL HIGH (ref 6–20)
CO2: 39 mmol/L — ABNORMAL HIGH (ref 22–32)
Calcium: 9.3 mg/dL (ref 8.9–10.3)
Chloride: 83 mmol/L — ABNORMAL LOW (ref 98–111)
Creatinine, Ser: 1.1 mg/dL — ABNORMAL HIGH (ref 0.44–1.00)
GFR, Estimated: 59 mL/min — ABNORMAL LOW (ref >60)
Glucose, Bld: 279 mg/dL — ABNORMAL HIGH (ref 70–99)
Potassium: 4.2 mmol/L (ref 3.5–5.1)
Sodium: 134 mmol/L — ABNORMAL LOW (ref 135–145)

## 2023-06-05 LAB — PHOSPHORUS: Phosphorus: 4.1 mg/dL (ref 2.5–4.6)

## 2023-06-05 LAB — CBC
HCT: 30 % — ABNORMAL LOW (ref 36.0–46.0)
Hemoglobin: 9.4 g/dL — ABNORMAL LOW (ref 12.0–15.0)
MCH: 32.2 pg (ref 26.0–34.0)
MCHC: 31.3 g/dL (ref 30.0–36.0)
MCV: 102.7 fL — ABNORMAL HIGH (ref 80.0–100.0)
Platelets: 168 10*3/uL (ref 150–400)
RBC: 2.92 MIL/uL — ABNORMAL LOW (ref 3.87–5.11)
RDW: 15.6 % — ABNORMAL HIGH (ref 11.5–15.5)
WBC: 8.7 10*3/uL (ref 4.0–10.5)
nRBC: 0 % (ref 0.0–0.2)

## 2023-06-05 LAB — MAGNESIUM: Magnesium: 1.7 mg/dL (ref 1.7–2.4)

## 2023-06-05 NOTE — Progress Notes (Signed)
This nurse educated pt on the importance of staying and receiving care. Pt stated " the night shift nurse tried her best to get me to stay but I want to go home". PT was educated on the risk of leaving and verbalized understanding. MD and charge nurse notified and IV removed.

## 2023-06-05 NOTE — Progress Notes (Addendum)
   06/05/23 0457  Provider Notification  Provider Name/Title Dr. Thomes Dinning  Date Provider Notified 06/05/23  Time Provider Notified [phone]  Method of Notification Page  Notification Reason Critical Result  Test performed and critical result [id number]  Date Critical Result Received 06/05/23  Time Critical Result Received 0457  Provider response Other (Comment) (awaiting orders)   No new orders received

## 2023-06-05 NOTE — Progress Notes (Signed)
Per telemetry patient is having small runs of vtach, Dr. Thomes Dinning made aware.

## 2023-06-05 NOTE — Progress Notes (Signed)
Dr. Thomes Dinning at bedside at this time

## 2023-06-05 NOTE — Progress Notes (Signed)
Progress Note  RN called due to patient requesting to leave AMA because of not being able to vape.  Patient was advised against this, nicotine patch was provided.     BP 121/73   Pulse 70   Temp 98.7 F (37.1 C)   Resp 20   Ht 5\' 8"  (1.727 m)   Wt (!) 162.8 kg   SpO2 98%   BMI 54.57 kg/m   pH 7.39, pCO2 85, pO2 53, Bicarb 51.5 O2sat 88%  At bedside, patient was sitting on a chair and dozing off, but was quickly arousable and was not in any acute distress, despite being hypercapnic and hypoxic, she states that she was chronically hypercapnic.  She was supposed to be on BiPAP, but has not been compliant with this due to being claustrophobic. We shall continue to monitor patient and treat accordingly.  Within 10 minutes of being at patient's bedside, RN called to say that patient insisted on leaving AMA ( please refer to RN's progress note). I had a face to face update with Dr Mariea Clonts regarding the patient's leaving AMA.     Total time:  33 minutes This includes time reviewing the chart including progress notes, labs, EKGs, taking medical decisions, ordering labs and documenting findings.

## 2023-06-05 NOTE — Progress Notes (Signed)
Patient stated she wants to leave AMA, even after education provided on health status and lab values. After Dr. Thomes Dinning left bedside, tech came in to obtain weights and vitals, patient refused all care, stating she does not want to be here, that she has been in hospitals her whole life and she can't be here another day. AMA paperwork obtained and signed by patient and placed in patient chart. Dr. Thomes Dinning made aware and has spoken to patient about health status and knows paperwork has been signed and that patient is leaving against medical advice.

## 2023-06-05 NOTE — Progress Notes (Signed)
I Attempted to see patient this am, but she had already left AMA despite attempts to convince her to stay by Dr. Thomes Dinning and Nursing staff this am  Pt was neither seen, nor evaluated by me as she left AMA prior to being able to see her  This is a No charge Note Jennifer Odonnell

## 2023-06-05 NOTE — Progress Notes (Signed)
Per telemetry vfib and vtach ringing out, Dr. Thomes Dinning aware, EKG 12 lead obtained and uploaded.

## 2023-06-09 LAB — BLOOD GAS, VENOUS
Acid-Base Excess: 21.4 mmol/L — ABNORMAL HIGH (ref 0.0–2.0)
Bicarbonate: 51.5 mmol/L — ABNORMAL HIGH (ref 20.0–28.0)
Drawn by: 6643
O2 Saturation: 88 %
Patient temperature: 37.1
pCO2, Ven: 85 mm[Hg] (ref 44–60)
pH, Ven: 7.39 (ref 7.25–7.43)
pO2, Ven: 53 mm[Hg] — ABNORMAL HIGH (ref 32–45)

## 2023-06-10 NOTE — TOC CM/SW Note (Signed)
10/8: CSW received call from [facility] in [place] regarding [facility] referral CSW sent for pt. CSW updated that pt had left AMA and hospital was unable to place [facility] orders due to pt leaving AMA.

## 2023-09-07 ENCOUNTER — Emergency Department (HOSPITAL_COMMUNITY): Payer: Medicaid Other

## 2023-09-07 ENCOUNTER — Encounter (HOSPITAL_COMMUNITY): Payer: Self-pay

## 2023-09-07 ENCOUNTER — Emergency Department (HOSPITAL_COMMUNITY)
Admission: EM | Admit: 2023-09-07 | Discharge: 2023-09-07 | Disposition: A | Discharge status: Home / Self Care | Payer: Medicaid Other | Attending: Emergency Medicine | Admitting: Emergency Medicine

## 2023-09-07 ENCOUNTER — Other Ambulatory Visit: Payer: Self-pay

## 2023-09-07 DIAGNOSIS — Z7982 Long term (current) use of aspirin: Secondary | ICD-10-CM | POA: Diagnosis not present

## 2023-09-07 DIAGNOSIS — J9611 Chronic respiratory failure with hypoxia: Secondary | ICD-10-CM

## 2023-09-07 DIAGNOSIS — Z7984 Long term (current) use of oral hypoglycemic drugs: Secondary | ICD-10-CM | POA: Diagnosis not present

## 2023-09-07 DIAGNOSIS — J069 Acute upper respiratory infection, unspecified: Secondary | ICD-10-CM | POA: Diagnosis not present

## 2023-09-07 DIAGNOSIS — J810 Acute pulmonary edema: Secondary | ICD-10-CM | POA: Insufficient documentation

## 2023-09-07 DIAGNOSIS — R0602 Shortness of breath: Secondary | ICD-10-CM | POA: Diagnosis present

## 2023-09-07 DIAGNOSIS — Z794 Long term (current) use of insulin: Secondary | ICD-10-CM | POA: Insufficient documentation

## 2023-09-07 DIAGNOSIS — E119 Type 2 diabetes mellitus without complications: Secondary | ICD-10-CM | POA: Diagnosis not present

## 2023-09-07 DIAGNOSIS — Z20822 Contact with and (suspected) exposure to covid-19: Secondary | ICD-10-CM | POA: Diagnosis not present

## 2023-09-07 DIAGNOSIS — I10 Essential (primary) hypertension: Secondary | ICD-10-CM | POA: Diagnosis not present

## 2023-09-07 DIAGNOSIS — R600 Localized edema: Secondary | ICD-10-CM

## 2023-09-07 DIAGNOSIS — J449 Chronic obstructive pulmonary disease, unspecified: Secondary | ICD-10-CM | POA: Diagnosis not present

## 2023-09-07 DIAGNOSIS — Z79899 Other long term (current) drug therapy: Secondary | ICD-10-CM | POA: Insufficient documentation

## 2023-09-07 DIAGNOSIS — J441 Chronic obstructive pulmonary disease with (acute) exacerbation: Secondary | ICD-10-CM | POA: Diagnosis not present

## 2023-09-07 LAB — CBC WITH DIFFERENTIAL/PLATELET
Abs Immature Granulocytes: 0.12 10*3/uL — ABNORMAL HIGH (ref 0.00–0.07)
Basophils Absolute: 0.1 10*3/uL (ref 0.0–0.1)
Basophils Relative: 1 %
Eosinophils Absolute: 0.3 10*3/uL (ref 0.0–0.5)
Eosinophils Relative: 4 %
HCT: 29.5 % — ABNORMAL LOW (ref 36.0–46.0)
Hemoglobin: 9.2 g/dL — ABNORMAL LOW (ref 12.0–15.0)
Immature Granulocytes: 2 %
Lymphocytes Relative: 14 %
Lymphs Abs: 1.1 10*3/uL (ref 0.7–4.0)
MCH: 31.9 pg (ref 26.0–34.0)
MCHC: 31.2 g/dL (ref 30.0–36.0)
MCV: 102.4 fL — ABNORMAL HIGH (ref 80.0–100.0)
Monocytes Absolute: 0.5 10*3/uL (ref 0.1–1.0)
Monocytes Relative: 7 %
Neutro Abs: 5.8 10*3/uL (ref 1.7–7.7)
Neutrophils Relative %: 72 %
Platelets: 199 10*3/uL (ref 150–400)
RBC: 2.88 MIL/uL — ABNORMAL LOW (ref 3.87–5.11)
RDW: 15.6 % — ABNORMAL HIGH (ref 11.5–15.5)
WBC: 8 10*3/uL (ref 4.0–10.5)
nRBC: 0.4 % — ABNORMAL HIGH (ref 0.0–0.2)

## 2023-09-07 LAB — COMPREHENSIVE METABOLIC PANEL
ALT: 26 U/L (ref 0–44)
AST: 28 U/L (ref 15–41)
Albumin: 3.3 g/dL — ABNORMAL LOW (ref 3.5–5.0)
Alkaline Phosphatase: 64 U/L (ref 38–126)
Anion gap: 10 (ref 5–15)
BUN: 18 mg/dL (ref 6–20)
CO2: 36 mmol/L — ABNORMAL HIGH (ref 22–32)
Calcium: 9.4 mg/dL (ref 8.9–10.3)
Chloride: 92 mmol/L — ABNORMAL LOW (ref 98–111)
Creatinine, Ser: 0.83 mg/dL (ref 0.44–1.00)
GFR, Estimated: >60 mL/min (ref >60)
Glucose, Bld: 165 mg/dL — ABNORMAL HIGH (ref 70–99)
Potassium: 4.8 mmol/L (ref 3.5–5.1)
Sodium: 138 mmol/L (ref 135–145)
Total Bilirubin: 0.5 mg/dL (ref 0.0–1.2)
Total Protein: 6.6 g/dL (ref 6.5–8.1)

## 2023-09-07 LAB — RESP PANEL BY RT-PCR (RSV, FLU A&B, COVID)  RVPGX2
Influenza A by PCR: NEGATIVE
Influenza B by PCR: NEGATIVE
Resp Syncytial Virus by PCR: NEGATIVE
SARS Coronavirus 2 by RT PCR: NEGATIVE

## 2023-09-07 LAB — BLOOD GAS, VENOUS
Acid-Base Excess: 19.9 mmol/L — ABNORMAL HIGH (ref 0.0–2.0)
Bicarbonate: 49.6 mmol/L — ABNORMAL HIGH (ref 20.0–28.0)
Drawn by: 65579
O2 Saturation: 85.7 %
Patient temperature: 36.7
pCO2, Ven: 81 mm[Hg] (ref 44–60)
pH, Ven: 7.39 (ref 7.25–7.43)
pO2, Ven: 50 mm[Hg] — ABNORMAL HIGH (ref 32–45)

## 2023-09-07 LAB — BRAIN NATRIURETIC PEPTIDE: B Natriuretic Peptide: 93 pg/mL (ref 0.0–100.0)

## 2023-09-07 MED ORDER — PREDNISONE 20 MG PO TABS: 40.0000 mg | ORAL_TABLET | Freq: Every day | ORAL | 0 refills | Status: DC

## 2023-09-07 MED ORDER — ONDANSETRON HCL 4 MG/2ML IJ SOLN
4.0000 mg | Freq: Once | INTRAMUSCULAR | Status: AC
  Administered 2023-09-07: 4 mg via INTRAVENOUS
  Filled 2023-09-07: qty 2

## 2023-09-07 MED ORDER — ACETAMINOPHEN 500 MG PO TABS: 1000.0000 mg | ORAL_TABLET | Freq: Once | ORAL | Status: DC

## 2023-09-07 MED ORDER — SODIUM CHLORIDE 0.9 % IV SOLN
1.0000 g | Freq: Once | INTRAVENOUS | Status: DC
  Filled 2023-09-07: qty 10

## 2023-09-07 MED ORDER — FENTANYL CITRATE PF 50 MCG/ML IJ SOSY
50.0000 ug | PREFILLED_SYRINGE | Freq: Once | INTRAMUSCULAR | Status: AC
  Administered 2023-09-07: 50 ug via INTRAVENOUS
  Filled 2023-09-07: qty 1

## 2023-09-07 MED ORDER — FUROSEMIDE 40 MG PO TABS: 40.0000 mg | ORAL_TABLET | Freq: Every day | ORAL | 1 refills | Status: DC

## 2023-09-07 MED ORDER — FENTANYL CITRATE PF 50 MCG/ML IJ SOSY: 50.0000 ug | PREFILLED_SYRINGE | Freq: Once | INTRAMUSCULAR | Status: DC

## 2023-09-07 MED ORDER — DOXYCYCLINE HYCLATE 100 MG PO CAPS: 100.0000 mg | ORAL_CAPSULE | Freq: Two times a day (BID) | ORAL | 0 refills | Status: AC

## 2023-09-07 MED ORDER — FUROSEMIDE 10 MG/ML IJ SOLN
40.0000 mg | INTRAMUSCULAR | Status: AC
  Administered 2023-09-07: 40 mg via INTRAVENOUS
  Filled 2023-09-07: qty 4

## 2023-09-07 NOTE — ED Notes (Signed)
 AVS with prescriptions provided to and discussed with patient and family member at bedside. Pt verbalizes understanding of discharge instructions and denies any questions or concerns at this time. Pt has ride home. Pt taken out of department via W/C.  ? ?

## 2023-09-07 NOTE — Consult Note (Signed)
 MEDICAL CONSULTATION NOTE [facility]  Lorene Klimas FMW:[id number] DOB: 02/27/1966 DOA: 09/07/2023  PCP: Meade Bigness, MD  Patient coming from: HOME   I have personally briefly reviewed patient's old medical records in [facility]  Chief Complaint: KNEE PAIN   HPI: Jennifer Odonnell is a 58 y.o. female with a complex medical history including morbid obesity with OHS, chronic respiratory failure with oxygen dependence on nasal cannula 8 L/min at baseline, crest syndrome, osteoarthritis, chronic pain on opioid, diabetes mellitus, peripheral neuropathy, degenerative bone disease, chronic peripheral edema bilateral lower extremities, presumed diastolic heart dysfunction from body habitus, hypertension, hyperlipidemia, chronic yeast infections presented to ED from home complaining of knee pain since having a fall at home several days ago.  She had been seen at an urgent care in [facility] [place]  yesterday.  She wanted to get x-rays done.  She reports with the changes in weather recently she has had nasal congestion cough and occasional shortness of breath with exertion.  She remains on her home oxygen requirement of 8 L/min nasal cannula.  She is speaking in full sentences.  She is in no distress.  Her ED trauma workup revealed negative knee x-rays.  Her chest x-ray showed possible pulmonary edema and possible pneumonia.  Pt says she has had a nonproductive cough but no fever or chills.  Pt says she wants to take antibiotics and go home today.  She was given IV furosemide  in ED and IV ceftriaxone  and she says she has been diuresing well.  She also says she diureses better when she takes 2 of her 20 mg lasix  tablets.    Past Medical History:  Diagnosis Date   Arthritis    Cancer (HCC)    cervical cancer at age 32   COPD (chronic obstructive pulmonary disease) (HCC)    CREST syndrome (HCC)    Degenerative disorder of bone    Diabetes mellitus without complication (HCC)     Hypertension    Neuropathy    Requires continuous at home supplemental oxygen     Past Surgical History:  Procedure Laterality Date   ABDOMINAL HYSTERECTOMY     BLADDER EXTROPHY RECONSTRUCTION PELVIC SAGITTAL OSTEOTOMY  2020   KNEE ARTHROSCOPY WITH PATELLA RECONSTRUCTION Bilateral    PARTIAL HYSTERECTOMY  07/2014   TONSILLECTOMY       reports that she has quit smoking. Her smoking use included cigarettes. She has a 30 pack-year smoking history. She has never used smokeless tobacco. She reports that she does not currently use alcohol. She reports that she does not currently use drugs.  Allergies  Allergen Reactions   Daucus Carota Swelling    Raw carrots   Lisinopril Anaphylaxis   Tiotropium Swelling   Erythromycin Diarrhea   Ambien  [Zolpidem ] Other (See Comments)    Woke up disoriented and scared.   Spiriva Handihaler [Tiotropium Bromide Monohydrate]     Family History  Problem Relation Age of Onset   Asthma Mother    Emphysema Mother    Arthritis Mother    Hyperlipidemia Mother    Diabetes Mother    Hypertension Mother    Heart disease Father    Heart attack Father    Cancer Father        throat   Hyperlipidemia Father    Alcohol abuse Father    Arthritis Father    Asthma Sister    Emphysema Sister    Seizures Sister    Hypertension Sister    Arthritis Brother  Hypertension Brother    Thyroid disease Daughter     Prior to Admission medications   Medication Sig Start Date End Date Taking? Authorizing Provider  doxycycline  (VIBRAMYCIN ) 100 MG capsule Take 1 capsule (100 mg total) by mouth 2 (two) times daily for 5 days. 09/07/23 09/12/23 Yes Quashon Jesus L, MD  ADVAIR DISKUS 250-50 MCG/ACT AEPB 1 puff 2 (two) times daily. 05/07/23   [provider]  albuterol  (PROVENTIL  HFA;VENTOLIN  HFA) 108 (90 BASE) MCG/ACT inhaler Inhale 2 puffs into the lungs every 6 (six) hours as needed for wheezing or shortness of breath.    [provider]   albuterol  (PROVENTIL ) (2.5 MG/3ML) 0.083% nebulizer solution Take 3 mLs (2.5 mg total) by nebulization every 4 (four) hours as needed for wheezing or shortness of breath. 09/11/20   Lia Vigilante L, MD  ascorbic acid  (VITAMIN C ) 500 MG tablet Take 500 mg by mouth daily.    [provider]  aspirin  81 MG EC tablet Take 81 mg by mouth daily.    [provider]  atenolol  (TENORMIN ) 25 MG tablet Take 25 mg by mouth daily. 08/14/20   [provider]  buPROPion  (WELLBUTRIN  XL) 300 MG 24 hr tablet Take 300 mg by mouth daily. 05/07/23   [provider]  carisoprodol  (SOMA ) 350 MG tablet Take 350 mg by mouth 3 (three) times daily. 08/04/20   [provider]  colchicine  0.6 MG tablet Take 0.6 mg by mouth daily. 09/04/20   [provider]  cyclobenzaprine  (FLEXERIL ) 10 MG tablet Take 10 mg by mouth 3 (three) times daily. 05/07/23   [provider]  docusate sodium  (COLACE) 100 MG capsule Take 100 mg by mouth 2 (two) times daily.    [provider]  fenofibrate  (TRICOR ) 48 MG tablet Take 48 mg by mouth daily. 08/14/20   [provider]  FEROSUL 325 (65 Fe) MG tablet Take 325 mg by mouth daily. 08/15/20   [provider]  folic acid  (FOLVITE ) 1 MG tablet Take 1 mg by mouth daily. 08/14/20   [provider]  furosemide  (LASIX ) 40 MG tablet Take 1 tablet (40 mg total) by mouth daily. 09/07/23   Natane Heward L, MD  gabapentin  (NEURONTIN ) 600 MG tablet Take 600 mg by mouth 3 (three) times daily. 04/15/23   [provider]  glipiZIDE  (GLUCOTROL  XL) 2.5 MG 24 hr tablet Take 2.5 mg by mouth every morning. 08/14/20   [provider]  HUMULIN R  100 UNIT/ML injection Inject 30 Units into the skin 3 (three) times daily before meals. 05/08/23   [provider]  hydroxychloroquine  (PLAQUENIL ) 200 MG tablet Take 400 mg by mouth daily. 08/14/20   [provider]  ibuprofen  (ADVIL ) 800 MG tablet  Take 800 mg by mouth 2 (two) times daily as needed. 05/07/23   [provider]  indomethacin  (INDOCIN ) 50 MG capsule Take 50 mg by mouth 3 (three) times daily. 05/07/23   [provider]  ipratropium-albuterol  (DUONEB) 0.5-2.5 (3) MG/3ML SOLN Take 3 mLs by nebulization 3 (three) times daily. 08/14/20   [provider]  LANTUS  SOLOSTAR 100 UNIT/ML Solostar Pen Inject 68 Units into the skin 2 (two) times daily. 09/04/20   [provider]  losartan -hydrochlorothiazide  (HYZAAR) 100-12.5 MG tablet Take 1 tablet by mouth daily. 08/14/20   [provider]  montelukast  (SINGULAIR ) 10 MG tablet Take 10 mg by mouth daily. 05/07/23   [provider]  Multiple Vitamins-Calcium (ONE-A-DAY WOMENS FORMULA) TABS Take 1 tablet  by mouth daily.    [provider]  nystatin  (MYCOSTATIN /NYSTOP ) powder Apply topically 2 (two) times daily. 02/15/23   [provider]  nystatin  cream (MYCOSTATIN ) Apply topically 2 (two) times daily. 05/07/23   [provider]  nystatin  ointment (MYCOSTATIN ) Apply 1 application topically 2 (two) times daily.    [provider]  omeprazole (PRILOSEC) 40 MG capsule Take 40 mg by mouth daily. 08/15/20   [provider]  ondansetron  (ZOFRAN -ODT) 4 MG disintegrating tablet Take 4 mg by mouth 3 (three) times daily as needed. 06/02/23   [provider]  Oxycodone  HCl 10 MG TABS Take 10 mg by mouth 4 (four) times daily. 08/25/20   [provider]  predniSONE  (DELTASONE ) 20 MG tablet Take 2 tablets (40 mg total) by mouth daily with breakfast. Take 2 PO QAM x5 days 09/07/23   Vicci Afton CROME, MD  PREMARIN  0.625 MG tablet Take 0.625 mg by mouth daily. 08/14/20   [provider]  promethazine  (PHENERGAN ) 25 MG tablet Take 25 mg by mouth every 12 (twelve) hours.    [provider]  promethazine -dextromethorphan  (PROMETHAZINE -DM) 6.25-15 MG/5ML syrup Take 5 mLs by mouth 3 (three)  times daily as needed. 06/02/23   [provider]  sertraline  (ZOLOFT ) 50 MG tablet Take 50 mg by mouth daily. 05/08/23   [provider]  simvastatin  (ZOCOR ) 40 MG tablet Take 40 mg by mouth every evening. 08/14/20   [provider]  sitaGLIPtin (JANUVIA) 100 MG tablet Take 100 mg by mouth daily.    [provider]  Vitamin D , Ergocalciferol , (DRISDOL) 1.25 MG (50000 UNIT) CAPS capsule Take 50,000 Units by mouth once a week. 08/14/20   [provider]   Physical Exam: Vitals:   09/07/23 1045 09/07/23 1245 09/07/23 1330 09/07/23 1415  BP: 107/80 125/61 (!) 140/67   Pulse: 86 82 78 87  Resp:  20 19 18   Temp:      TempSrc:      SpO2: 99% 97% 97% 95%  Weight:      Height:       Constitutional: morbidly obese female, sitting on gurnee, awake, alert, cooperative, speaking full sentences, NAD, calm, comfortable Eyes: PERRL, lids and conjunctivae normal ENMT: Mucous membranes are moist. Posterior pharynx clear of any exudate or lesions.Normal dentition.  Neck: normal, supple, no masses, no thyromegaly Respiratory: breath sounds difficult to hear from body habitus, rare expiratory wheezes heard. No rales heard.  Cardiovascular: normal s1, s2 sounds, no murmurs / rubs / gallops. 1+ bilateral lower extremity edema. 2+ pedal pulses. No carotid bruits.  Abdomen: no tenderness, no masses palpated. No hepatosplenomegaly. Bowel sounds positive.  Musculoskeletal: no clubbing / cyanosis. No joint deformity upper and lower extremities. Good ROM, no contractures. Normal muscle tone.  Skin: chronic venous stasis changes BLEs.   Neurologic: CN 2-12 grossly intact. Sensation intact, DTR normal. Strength 5/5 in all 4.  Psychiatric: Normal judgment and insight. Alert and oriented x 3. Normal mood.   Labs on Admission: I have personally reviewed following labs and imaging studies  CBC: Recent Labs  Lab 09/07/23 1021  WBC 8.0  NEUTROABS 5.8  HGB 9.2*  HCT 29.5*   MCV 102.4*  PLT 199   Basic Metabolic Panel: Recent Labs  Lab 09/07/23 1023  NA 138  K 4.8  CL 92*  CO2 36*  GLUCOSE 165*  BUN 18  CREATININE 0.83  CALCIUM 9.4   GFR: Estimated Creatinine Clearance: 123.9 mL/min (by C-G formula based on  SCr of 0.83 mg/dL). Liver Function Tests: Recent Labs  Lab 09/07/23 1023  AST 28  ALT 26  ALKPHOS 64  BILITOT 0.5  PROT 6.6  ALBUMIN 3.3*   No results for input(s): LIPASE, AMYLASE in the last 168 hours. No results for input(s): AMMONIA in the last 168 hours. Coagulation Profile: No results for input(s): INR, PROTIME in the last 168 hours. Cardiac Enzymes: No results for input(s): CKTOTAL, CKMB, CKMBINDEX, TROPONINI in the last 168 hours. BNP (last 3 results) No results for input(s): PROBNP in the last 8760 hours. HbA1C: No results for input(s): HGBA1C in the last 72 hours. CBG: No results for input(s): GLUCAP in the last 168 hours. Lipid Profile: No results for input(s): CHOL, HDL, LDLCALC, TRIG, CHOLHDL, LDLDIRECT in the last 72 hours. Thyroid Function Tests: No results for input(s): TSH, T4TOTAL, FREET4, T3FREE, THYROIDAB in the last 72 hours. Anemia Panel: No results for input(s): VITAMINB12, FOLATE, FERRITIN, TIBC, IRON, RETICCTPCT in the last 72 hours. Urine analysis:    Component Value Date/Time   COLORURINE YELLOW 06/03/2023 2335   APPEARANCEUR HAZY (A) 06/03/2023 2335   LABSPEC 1.016 06/03/2023 2335   PHURINE 7.0 06/03/2023 2335   GLUCOSEU NEGATIVE 06/03/2023 2335   HGBUR NEGATIVE 06/03/2023 2335   BILIRUBINUR NEGATIVE 06/03/2023 2335   KETONESUR NEGATIVE 06/03/2023 2335   PROTEINUR 30 (A) 06/03/2023 2335   NITRITE NEGATIVE 06/03/2023 2335   LEUKOCYTESUR NEGATIVE 06/03/2023 2335    Radiological Exams on Admission: DG Knee 2 Views Right Result Date: 09/07/2023 CLINICAL DATA:  Knee pain after a fall. EXAM: RIGHT KNEE - 1-2 VIEW COMPARISON:  Knee  radiographs dated 06/03/2023. FINDINGS: No evidence of fracture, dislocation, or joint effusion. 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 Fixation screws are seen in the tibia. There is soft tissue swelling around the knee. IMPRESSION: No acute fracture or dislocation. Electronically Signed   By: Norman Hopper M.D.   On: 09/07/2023 12:32   DG Knee 2 Views Left Result Date: 09/07/2023 CLINICAL DATA:  Knee pain after a fall. EXAM: LEFT KNEE - 1-2 VIEW COMPARISON:  Knee radiographs dated 06/03/2023 FINDINGS: No evidence of fracture, dislocation, or joint effusion. There is moderate to severe tricompartmental osteoarthritis. Fixation screws are seen in the tibia. Soft tissue swelling around the knee. IMPRESSION: No acute fracture or dislocation. Electronically Signed   By: Norman Hopper M.D.   On: 09/07/2023 12:30   DG Chest 2 View Result Date: 09/07/2023 CLINICAL DATA:  Fall with congestion, dyspnea. EXAM: CHEST - 2 VIEW COMPARISON:  Chest radiograph dated 06/03/2023. FINDINGS: The heart is enlarged. There is mild-to-moderate patchy bilateral interstitial and airspace opacities. Bibasilar atelectasis likely contributes. No pleural effusion or pneumothorax. Degenerative changes are seen in the spine. IMPRESSION: Mild-to-moderate patchy bilateral interstitial and airspace opacities may reflect pulmonary edema or multifocal pneumonia. Electronically Signed   By: Norman Hopper M.D.   On: 09/07/2023 12:29   Assessment/Plan  Traumatic Fall with knee pain Chronic hypoxic respiratory failure Morbid obesity with OHS Upper respiratory Infection Mild COPD Exacerbation   Pt had a reassuring trauma work up in ED and her knees are not fractured and starting to feel better.  She says she can manage now with her home chronic  pain medication Fall precautions recommended  URI with mild COPD exacerbation - will cover her with a course of oral prednisone  40 mg daily, doxycycline  BID x 5d and increase her home  oral furosemide  to 40 mg daily with potassium supplement.  Pt remains on her home  oxygen requirement and stable for discharge home.  Follow up with PCP in 1 week for recheck.  Pt advised to seek medical care or return to ED if symptoms worsen, don't improve or new problem arises. Pt verbalized understanding.  Pt will discharge home with husband in stable condition.     Time spent: 45 mins   Kazzandra Desaulniers MD [facility] How to contact the [facility] Attending or Consulting provider 7A - 7P or covering provider during after hours 7P -7A, for this patient?  Check the care team in [facility] and look for a) attending/consulting [facility] provider listed and b) the [facility] team listed Log into [email or web address] and use [facility] universal password to access. If you do not have the password, please contact the hospital operator. Locate the [facility] provider you are looking for under [facility] and page to a number that you can be directly reached. If you still have difficulty reaching the provider, please page the [facility] (Director on Call) for the Hospitalists listed on amion for assistance.   If 7PM-7AM, please contact night-coverage [email or web address] Password [email or web address]  09/07/2023, 3:02 PM

## 2023-09-07 NOTE — Discharge Instructions (Signed)
 IMPORTANT INFORMATION: PAY CLOSE ATTENTION   PHYSICIAN DISCHARGE INSTRUCTIONS  Follow with Primary care provider  Meade Bigness, MD  and other consultants as instructed by your Hospitalist Physician  SEEK MEDICAL CARE OR RETURN TO EMERGENCY ROOM IF SYMPTOMS COME BACK, WORSEN OR NEW PROBLEM DEVELOPS   Please note: You were cared for by a hospitalist during your hospital stay. Every effort will be made to forward records to your primary care provider.  You can request that your primary care provider send for your hospital records if they have not received them.  Once you are discharged, your primary care physician will handle any further medical issues. Please note that NO REFILLS for any discharge medications will be authorized once you are discharged, as it is imperative that you return to your primary care physician (or establish a relationship with a primary care physician if you do not have one) for your post hospital discharge needs so that they can reassess your need for medications and monitor your lab values.  Please get a complete blood count and chemistry panel checked by your Primary MD at your next visit, and again as instructed by your Primary MD.  Get Medicines reviewed and adjusted: Please take all your medications with you for your next visit with your Primary MD  Laboratory/radiological data: Please request your Primary MD to go over all hospital tests and procedure/radiological results at the follow up, please ask your primary care provider to get all Hospital records sent to his/her office.  In some cases, they will be blood work, cultures and biopsy results pending at the time of your discharge. Please request that your primary care provider follow up on these results.  If you are diabetic, please bring your blood sugar readings with you to your follow up appointment with primary care.    Please call and make your follow up appointments as soon as possible.    Also Note  the following: If you experience worsening of your admission symptoms, develop shortness of breath, life threatening emergency, suicidal or homicidal thoughts you must seek medical attention immediately by calling 911 or calling your MD immediately  if symptoms less severe.  You must read complete instructions/literature along with all the possible adverse reactions/side effects for all the Medicines you take and that have been prescribed to you. Take any new Medicines after you have completely understood and accpet all the possible adverse reactions/side effects.   Do not drive when taking Pain medications or sleeping medications (Benzodiazepines)  Do not take more than prescribed Pain, Sleep and Anxiety Medications. It is not advisable to combine anxiety,sleep and pain medications without talking with your primary care practitioner  Special Instructions: If you have smoked or chewed Tobacco  in the last 2 yrs please stop smoking, stop any regular Alcohol  and or any Recreational drug use.  Wear Seat belts while driving.  Do not drive if taking any narcotic, mind altering or controlled substances or recreational drugs or alcohol.

## 2023-09-07 NOTE — ED Triage Notes (Signed)
 Pt presents to ED from home C/O knee pain since fall several days ago. Pt also reports congestion, dyspnea since fall. Wears 8LPM [place] at baseline for COPD. Speaking in full sentences in triage.

## 2023-09-07 NOTE — ED Provider Notes (Signed)
 [place] [facility]ENC[facility] DEPARTMENT AT [facility] Provider Note   CSN: [id number] Arrival date & time: 09/07/23  9145     History  Chief Complaint  Patient presents with   Shortness of Breath   Knee Pain    Jennifer Odonnell is a 58 y.o. female with PMHx arthritis, COPD and chronic respiratory failure on 8L [place] at baseline, RA, DM, HTN who presents to ED with multiple complaints.   As for patient's BL knee pain, patient with chronic BL knee pain from RA. Patient stating that she fell on her knees 3 days ago and pain has been more severe since.   As for patient's SOB - Patient stating that her lungs have been shot for years after COVID. SOB became worse 3 days ago. Also with increased nose/chest conjestion, cough, mild sore throat. Deneis fever, chest pain, vomiting, diarrhea. Also with increased leg swelling x1 week.   Shortness of Breath Knee Pain      Home Medications Prior to Admission medications   Medication Sig Start Date End Date Taking? Authorizing Provider  doxycycline  (VIBRAMYCIN ) 100 MG capsule Take 1 capsule (100 mg total) by mouth 2 (two) times daily for 5 days. 09/07/23 09/12/23 Yes Johnson, [name], MD  ADVAIR DISKUS 250-50 MCG/ACT AEPB 1 puff 2 (two) times daily. 05/07/23   [provider]  albuterol  (PROVENTIL  HFA;VENTOLIN  HFA) 108 (90 BASE) MCG/ACT inhaler Inhale 2 puffs into the lungs every 6 (six) hours as needed for wheezing or shortness of breath.    [provider]  albuterol  (PROVENTIL ) (2.5 MG/3ML) 0.083% nebulizer solution Take 3 mLs (2.5 mg total) by nebulization every 4 (four) hours as needed for wheezing or shortness of breath. 09/11/20   Johnson, [name], MD  ascorbic acid  (VITAMIN C ) 500 MG tablet Take 500 mg by mouth daily.    [provider]  aspirin  81 MG EC tablet Take 81 mg by mouth daily.    [provider]  atenolol  (TENORMIN ) 25 MG tablet Take 25 mg by mouth daily. 08/14/20   [provider]  buPROPion  (WELLBUTRIN  XL) 300 MG 24 hr tablet Take 300 mg by mouth daily. 05/07/23   [provider]  carisoprodol  (SOMA ) 350 MG tablet Take 350 mg by mouth 3 (three) times daily. 08/04/20   [provider]  colchicine  0.6 MG tablet Take 0.6 mg by mouth daily. 09/04/20   [provider]  cyclobenzaprine  (FLEXERIL ) 10 MG tablet Take 10 mg by mouth 3 (three) times daily. 05/07/23   [provider]  docusate sodium  (COLACE) 100 MG capsule Take 100 mg by mouth 2 (two) times daily.    [provider]  fenofibrate  (TRICOR ) 48 MG tablet Take 48 mg by mouth daily. 08/14/20   [provider]  FEROSUL 325 (65 Fe) MG tablet Take 325 mg by mouth daily. 08/15/20   [provider]  folic acid  (FOLVITE ) 1 MG tablet Take 1 mg by mouth daily. 08/14/20   [provider]  furosemide  (LASIX ) 40 MG tablet Take 1 tablet (40 mg total) by mouth daily. 09/07/23   Johnson, [name], MD  gabapentin  (NEURONTIN ) 600 MG tablet Take 600 mg by mouth 3 (three) times daily. 04/15/23   [provider]  glipiZIDE  (GLUCOTROL  XL) 2.5 MG 24 hr tablet Take 2.5 mg by mouth every morning. 08/14/20   [provider]  HUMULIN R  100 UNIT/ML injection Inject 30 Units into the skin 3 (three) times daily before meals. 05/08/23  [provider]  hydroxychloroquine  (PLAQUENIL ) 200 MG tablet Take 400 mg by mouth daily. 08/14/20   [provider]  ibuprofen  (ADVIL ) 800 MG tablet Take 800 mg by mouth 2 (two) times daily as needed. 05/07/23   [provider]  indomethacin  (INDOCIN ) 50 MG capsule Take 50 mg by mouth 3 (three) times daily. 05/07/23   [provider]  ipratropium-albuterol  (DUONEB) 0.5-2.5 (3) MG/3ML SOLN Take 3 mLs by nebulization 3 (three) times daily. 08/14/20   [provider]  LANTUS  SOLOSTAR 100 UNIT/ML Solostar Pen Inject 68 Units into the skin 2 (two) times daily. 09/04/20   [provider]  losartan -hydrochlorothiazide  (HYZAAR) 100-12.5 MG tablet Take 1 tablet by mouth daily. 08/14/20   [provider]  montelukast  (SINGULAIR ) 10 MG tablet Take 10 mg by mouth daily. 05/07/23   [provider]  Multiple Vitamins-Calcium (ONE-A-DAY WOMENS FORMULA) TABS Take 1 tablet by mouth daily.    [provider]  nystatin  (MYCOSTATIN /NYSTOP ) powder Apply topically 2 (two) times daily. 02/15/23   [provider]  nystatin  cream (MYCOSTATIN ) Apply topically 2 (two) times daily. 05/07/23   [provider]  nystatin  ointment (MYCOSTATIN ) Apply 1 application topically 2 (two) times daily.    [provider]  omeprazole (PRILOSEC) 40 MG capsule Take 40 mg by mouth daily. 08/15/20   [provider]  ondansetron  (ZOFRAN -ODT) 4 MG disintegrating tablet Take 4 mg by mouth 3 (three) times daily as needed. 06/02/23   [provider]  Oxycodone  HCl 10 MG TABS Take 10 mg by mouth 4 (four) times daily. 08/25/20   [provider]  predniSONE  (DELTASONE ) 20 MG tablet Take 2 tablets (40 mg total) by mouth daily with breakfast. Take 2 PO QAM x5 days 09/07/23   Vicci Afton CROME, MD  PREMARIN  0.625 MG tablet Take 0.625 mg by mouth daily. 08/14/20   [provider]  promethazine  (PHENERGAN ) 25 MG tablet Take 25 mg by mouth every 12 (twelve) hours.    [provider]  promethazine -dextromethorphan  (PROMETHAZINE -DM) 6.25-15 MG/5ML syrup Take 5 mLs by mouth 3 (three) times daily as needed. 06/02/23   [provider]  sertraline  (ZOLOFT ) 50 MG tablet Take 50 mg by mouth daily. 05/08/23   [provider]  simvastatin  (ZOCOR ) 40 MG tablet Take 40 mg by mouth every evening. 08/14/20   [provider]  sitaGLIPtin (JANUVIA) 100 MG tablet Take 100 mg by mouth daily.    [provider]  Vitamin D , Ergocalciferol , (DRISDOL) 1.25 MG (50000 UNIT) CAPS capsule Take 50,000 Units by mouth once a week.  08/14/20   [provider]      Allergies    Daucus carota, Lisinopril, Tiotropium, Erythromycin, Ambien  [zolpidem ], and Spiriva handihaler [tiotropium bromide monohydrate]    Review of Systems   Review of Systems  Respiratory:  Positive for shortness of breath.     Physical Exam Updated Vital Signs BP (!) 144/77   Pulse 86   Temp 98.1 F (36.7 C) (Oral)   Resp 16   Ht 5' 8 (1.727 m)   Wt (!) 163.3 kg   SpO2 98%   BMI 54.74 kg/m  Physical Exam Vitals and nursing note reviewed.  Constitutional:      General: She is not in acute distress.    Appearance: She is not ill-appearing or toxic-appearing.  HENT:     Head: Normocephalic and atraumatic.     Mouth/Throat:     Mouth: Mucous membranes are moist.  Pharynx: No oropharyngeal exudate or posterior oropharyngeal erythema.  Eyes:     General: No scleral icterus.       Right eye: No discharge.        Left eye: No discharge.     Conjunctiva/sclera: Conjunctivae normal.  Cardiovascular:     Rate and Rhythm: Normal rate and regular rhythm.     Pulses: Normal pulses.     Heart sounds: No murmur heard. Pulmonary:     Effort: Pulmonary effort is normal. No tachypnea or respiratory distress.     Breath sounds: Decreased breath sounds present. No wheezing, rhonchi or rales.  Abdominal:     Tenderness: There is no abdominal tenderness.  Musculoskeletal:     Right lower leg: No edema.     Left lower leg: No edema.     Comments: +1 pitting edema BL legs  Skin:    General: Skin is warm and dry.     Findings: No rash.  Neurological:     General: No focal deficit present.     Mental Status: She is alert and oriented to person, place, and time. Mental status is at baseline.  Psychiatric:        Mood and Affect: Mood normal.        Behavior: Behavior normal.     ED Results / Procedures / Treatments   Labs (all labs ordered are listed, but only abnormal results are displayed) Labs Reviewed  CBC WITH  DIFFERENTIAL/PLATELET - Abnormal; Notable for the following components:      Result Value   RBC 2.88 (*)    Hemoglobin 9.2 (*)    HCT 29.5 (*)    MCV 102.4 (*)    RDW 15.6 (*)    nRBC 0.4 (*)    Abs Immature Granulocytes 0.12 (*)    All other components within normal limits  COMPREHENSIVE METABOLIC PANEL - Abnormal; Notable for the following components:   Chloride 92 (*)    CO2 36 (*)    Glucose, Bld 165 (*)    Albumin 3.3 (*)    All other components within normal limits  BLOOD GAS, VENOUS - Abnormal; Notable for the following components:   pCO2, Ven 81 (*)    pO2, Ven 50 (*)    Bicarbonate 49.6 (*)    Acid-Base Excess 19.9 (*)    All other components within normal limits  RESP PANEL BY RT-PCR (RSV, FLU A&B, COVID)  RVPGX2  BRAIN NATRIURETIC PEPTIDE    EKG EKG Interpretation Date/Time:  Sunday September 07 2023 10:01:39 EST Ventricular Rate:  75 PR Interval:    QRS Duration:  95 QT Interval:  356 QTC Calculation: 398 R Axis:   9  Text Interpretation: Sinus rhythm Low voltage, precordial leads Minimal ST elevation, anterolateral leads  artifact in multiple leads Confirmed by Patsey Lot [phone]) on 09/07/2023 10:12:10 AM  Radiology DG Knee 2 Views Right Result Date: 09/07/2023 CLINICAL DATA:  Knee pain after a fall. EXAM: RIGHT KNEE - 1-2 VIEW COMPARISON:  Knee radiographs dated 06/03/2023. FINDINGS: No evidence of fracture, dislocation, or joint effusion. 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 Fixation screws are seen in the tibia. There is soft tissue swelling around the knee. IMPRESSION: No acute fracture or dislocation. Electronically Signed   By: Norman Hopper M.D.   On: 09/07/2023 12:32   DG Knee 2 Views Left Result Date: 09/07/2023 CLINICAL DATA:  Knee pain after a fall. EXAM: LEFT KNEE - 1-2 VIEW COMPARISON:  Knee radiographs dated 06/03/2023  FINDINGS: No evidence of fracture, dislocation, or joint effusion. There is moderate to severe tricompartmental  osteoarthritis. Fixation screws are seen in the tibia. Soft tissue swelling around the knee. IMPRESSION: No acute fracture or dislocation. Electronically Signed   By: Norman Hopper M.D.   On: 09/07/2023 12:30   DG Chest 2 View Result Date: 09/07/2023 CLINICAL DATA:  Fall with congestion, dyspnea. EXAM: CHEST - 2 VIEW COMPARISON:  Chest radiograph dated 06/03/2023. FINDINGS: The heart is enlarged. There is mild-to-moderate patchy bilateral interstitial and airspace opacities. Bibasilar atelectasis likely contributes. No pleural effusion or pneumothorax. Degenerative changes are seen in the spine. IMPRESSION: Mild-to-moderate patchy bilateral interstitial and airspace opacities may reflect pulmonary edema or multifocal pneumonia. Electronically Signed   By: Norman Hopper M.D.   On: 09/07/2023 12:29    Procedures Procedures    Medications Ordered in ED Medications  ondansetron  (ZOFRAN ) injection 4 mg (4 mg Intravenous Given 09/07/23 1108)  furosemide  (LASIX ) injection 40 mg (40 mg Intravenous Given 09/07/23 1329)  fentaNYL  (SUBLIMAZE ) injection 50 mcg (50 mcg Intravenous Given 09/07/23 1329)  fentaNYL  (SUBLIMAZE ) injection 50 mcg (50 mcg Intravenous Given 09/07/23 1520)    ED Course/ Medical Decision Making/ A&P                                 Medical Decision Making Amount and/or Complexity of Data Reviewed Labs: ordered. Radiology: ordered.  Risk Prescription drug management.   This patient presents to the ED for concern of shortness of breath, this involves an extensive number of treatment options, and is a complaint that carries with it a high risk of complications and morbidity.  The differential diagnosis includes Anxiety, Anaphylaxis/Angioedema, Aspirated FB, Arrhythmia, CHF, Asthma, COPD, PNA, COVID/Flu/RSV, STEMI, Tamponade, TPNX, Sepsis   Co morbidities that complicate the patient evaluation  arthritis, COPD and chronic respiratory failure on 8L [place] at baseline, DM,  HTN   Additional history obtained:  Dr. Meade PCP   Problem List / ED Course / Critical interventions / Medication management  Patient presents to ED concerned for worsening cough and SOB x3 days. Also with congestion and rhinorrhea.  Also endorses worsening BL leg swelling x1 week in which she is on 20 lasix  daily for. Denies fever. I Ordered, and personally interpreted labs.  CBC without leukocytosis.  Anemia is present with hemoglobin of 9.2.  Respiratory panel negative.  BNP within normal limits.  CMP reassuring.  VBG with normal pH and high CO2 at 81. The patient was maintained on a cardiac monitor.  I personally viewed and interpreted the cardiac monitored which showed an underlying rhythm of: Sinus rhythm I ordered imaging studies including chest xray to assess for process contributing to patient's symptoms.  Also ordered BL knee x-rays to assess patient's acute on chronic knee pain.  I independently visualized and interpreted imaging which showed pulmonary edema vs multifocal multiple pneumonia on chest x-ray.  Knee x-rays were unremarkable.. I agree with the radiologist interpretation Provided patient with 40mg  lasix  to help with her worseningn BL leg edema.  I have reviewed the patients home medicines and have made adjustments as needed Consulted with Hospitalist Dr. Vicci who was able to see patient in. He talked with patient and they agreed to proceed with outpatient treatment at this time. I appreciate Dr. Ferdie help. Patient afebrile with stable vitals.  Provided with return precautions.  Discharged in good condition.   Social Determinants of Health:  none          Final Clinical Impression(s) / ED Diagnoses Final diagnoses:  Acute pulmonary edema (HCC)  Viral URI    Rx / DC Orders ED Discharge Orders          Ordered    furosemide  (LASIX ) 40 MG tablet  Daily        09/07/23 1500    predniSONE  (DELTASONE ) 20 MG tablet  Daily with breakfast         09/07/23 1500    doxycycline  (VIBRAMYCIN ) 100 MG capsule  2 times daily        09/07/23 1500              Hoy Nidia FALCON, PA-C 09/07/23 1538    Patsey Lot, MD 09/08/23 1524

## 2023-09-26 ENCOUNTER — Encounter (HOSPITAL_COMMUNITY): Payer: Self-pay | Admitting: Internal Medicine

## 2023-09-26 ENCOUNTER — Inpatient Hospital Stay (HOSPITAL_COMMUNITY)
Admission: EM | Admit: 2023-09-26 | Discharge: 2023-09-27 | DRG: 177 | Disposition: A | Discharge status: Home / Self Care | Payer: Medicaid Other | Attending: Internal Medicine | Admitting: Internal Medicine

## 2023-09-26 ENCOUNTER — Other Ambulatory Visit: Payer: Self-pay

## 2023-09-26 ENCOUNTER — Emergency Department (HOSPITAL_COMMUNITY): Payer: Medicaid Other

## 2023-09-26 DIAGNOSIS — Z7951 Long term (current) use of inhaled steroids: Secondary | ICD-10-CM | POA: Diagnosis not present

## 2023-09-26 DIAGNOSIS — Z90711 Acquired absence of uterus with remaining cervical stump: Secondary | ICD-10-CM

## 2023-09-26 DIAGNOSIS — Z7984 Long term (current) use of oral hypoglycemic drugs: Secondary | ICD-10-CM

## 2023-09-26 DIAGNOSIS — E119 Type 2 diabetes mellitus without complications: Secondary | ICD-10-CM

## 2023-09-26 DIAGNOSIS — E1165 Type 2 diabetes mellitus with hyperglycemia: Secondary | ICD-10-CM | POA: Diagnosis present

## 2023-09-26 DIAGNOSIS — J9621 Acute and chronic respiratory failure with hypoxia: Secondary | ICD-10-CM | POA: Diagnosis present

## 2023-09-26 DIAGNOSIS — Z888 Allergy status to other drugs, medicaments and biological substances status: Secondary | ICD-10-CM

## 2023-09-26 DIAGNOSIS — Z9981 Dependence on supplemental oxygen: Secondary | ICD-10-CM | POA: Diagnosis not present

## 2023-09-26 DIAGNOSIS — U071 COVID-19: Principal | ICD-10-CM | POA: Diagnosis present

## 2023-09-26 DIAGNOSIS — M341 CR(E)ST syndrome: Secondary | ICD-10-CM | POA: Diagnosis present

## 2023-09-26 DIAGNOSIS — Z79899 Other long term (current) drug therapy: Secondary | ICD-10-CM | POA: Diagnosis not present

## 2023-09-26 DIAGNOSIS — Z833 Family history of diabetes mellitus: Secondary | ICD-10-CM | POA: Diagnosis not present

## 2023-09-26 DIAGNOSIS — Z8261 Family history of arthritis: Secondary | ICD-10-CM | POA: Diagnosis not present

## 2023-09-26 DIAGNOSIS — Z8249 Family history of ischemic heart disease and other diseases of the circulatory system: Secondary | ICD-10-CM

## 2023-09-26 DIAGNOSIS — Z811 Family history of alcohol abuse and dependence: Secondary | ICD-10-CM | POA: Diagnosis not present

## 2023-09-26 DIAGNOSIS — Z6841 Body Mass Index (BMI) 40.0 and over, adult: Secondary | ICD-10-CM

## 2023-09-26 DIAGNOSIS — Z808 Family history of malignant neoplasm of other organs or systems: Secondary | ICD-10-CM

## 2023-09-26 DIAGNOSIS — Z825 Family history of asthma and other chronic lower respiratory diseases: Secondary | ICD-10-CM

## 2023-09-26 DIAGNOSIS — I10 Essential (primary) hypertension: Secondary | ICD-10-CM | POA: Diagnosis present

## 2023-09-26 DIAGNOSIS — Z83438 Family history of other disorder of lipoprotein metabolism and other lipidemia: Secondary | ICD-10-CM | POA: Diagnosis not present

## 2023-09-26 DIAGNOSIS — Z7982 Long term (current) use of aspirin: Secondary | ICD-10-CM | POA: Diagnosis not present

## 2023-09-26 DIAGNOSIS — Z87891 Personal history of nicotine dependence: Secondary | ICD-10-CM

## 2023-09-26 DIAGNOSIS — Z794 Long term (current) use of insulin: Secondary | ICD-10-CM

## 2023-09-26 DIAGNOSIS — M1990 Unspecified osteoarthritis, unspecified site: Secondary | ICD-10-CM | POA: Diagnosis present

## 2023-09-26 DIAGNOSIS — J1282 Pneumonia due to coronavirus disease 2019: Secondary | ICD-10-CM | POA: Diagnosis present

## 2023-09-26 DIAGNOSIS — R0602 Shortness of breath: Secondary | ICD-10-CM | POA: Diagnosis present

## 2023-09-26 DIAGNOSIS — J440 Chronic obstructive pulmonary disease with acute lower respiratory infection: Secondary | ICD-10-CM | POA: Diagnosis present

## 2023-09-26 DIAGNOSIS — Z8541 Personal history of malignant neoplasm of cervix uteri: Secondary | ICD-10-CM

## 2023-09-26 DIAGNOSIS — Z881 Allergy status to other antibiotic agents status: Secondary | ICD-10-CM

## 2023-09-26 LAB — URINALYSIS, W/ REFLEX TO CULTURE (INFECTION SUSPECTED)
Bacteria, UA: NONE SEEN
Bilirubin Urine: NEGATIVE
Glucose, UA: 150 mg/dL — AB
Hgb urine dipstick: NEGATIVE
Ketones, ur: 5 mg/dL — AB
Leukocytes,Ua: NEGATIVE
Nitrite: NEGATIVE
Protein, ur: 100 mg/dL — AB
Specific Gravity, Urine: 1.035 — ABNORMAL HIGH (ref 1.005–1.030)
pH: 6 (ref 5.0–8.0)

## 2023-09-26 LAB — PROTIME-INR
INR: 0.9 (ref 0.8–1.2)
Prothrombin Time: 12.1 s (ref 11.4–15.2)

## 2023-09-26 LAB — COMPREHENSIVE METABOLIC PANEL
ALT: 43 U/L (ref 0–44)
AST: 30 U/L (ref 15–41)
Albumin: 3.1 g/dL — ABNORMAL LOW (ref 3.5–5.0)
Alkaline Phosphatase: 63 U/L (ref 38–126)
Anion gap: 11 (ref 5–15)
BUN: 25 mg/dL — ABNORMAL HIGH (ref 6–20)
CO2: 39 mmol/L — ABNORMAL HIGH (ref 22–32)
Calcium: 9.3 mg/dL (ref 8.9–10.3)
Chloride: 91 mmol/L — ABNORMAL LOW (ref 98–111)
Creatinine, Ser: 0.96 mg/dL (ref 0.44–1.00)
GFR, Estimated: >60 mL/min (ref >60)
Glucose, Bld: 153 mg/dL — ABNORMAL HIGH (ref 70–99)
Potassium: 4.3 mmol/L (ref 3.5–5.1)
Sodium: 141 mmol/L (ref 135–145)
Total Bilirubin: 0.4 mg/dL (ref 0.0–1.2)
Total Protein: 6.8 g/dL (ref 6.5–8.1)

## 2023-09-26 LAB — RESP PANEL BY RT-PCR (RSV, FLU A&B, COVID)  RVPGX2
Influenza A by PCR: NEGATIVE
Influenza B by PCR: NEGATIVE
Resp Syncytial Virus by PCR: NEGATIVE
SARS Coronavirus 2 by RT PCR: POSITIVE — AB

## 2023-09-26 LAB — CBC WITH DIFFERENTIAL/PLATELET
Abs Immature Granulocytes: 0.4 10*3/uL — ABNORMAL HIGH (ref 0.00–0.07)
Basophils Absolute: 0 10*3/uL (ref 0.0–0.1)
Basophils Relative: 1 %
Eosinophils Absolute: 0.1 10*3/uL (ref 0.0–0.5)
Eosinophils Relative: 1 %
HCT: 29.7 % — ABNORMAL LOW (ref 36.0–46.0)
Hemoglobin: 9.1 g/dL — ABNORMAL LOW (ref 12.0–15.0)
Immature Granulocytes: 5 %
Lymphocytes Relative: 19 %
Lymphs Abs: 1.6 10*3/uL (ref 0.7–4.0)
MCH: 31 pg (ref 26.0–34.0)
MCHC: 30.6 g/dL (ref 30.0–36.0)
MCV: 101 fL — ABNORMAL HIGH (ref 80.0–100.0)
Monocytes Absolute: 0.4 10*3/uL (ref 0.1–1.0)
Monocytes Relative: 5 %
Neutro Abs: 5.6 10*3/uL (ref 1.7–7.7)
Neutrophils Relative %: 69 %
Platelets: 245 10*3/uL (ref 150–400)
RBC: 2.94 MIL/uL — ABNORMAL LOW (ref 3.87–5.11)
RDW: 14.8 % (ref 11.5–15.5)
WBC: 8.1 10*3/uL (ref 4.0–10.5)
nRBC: 0.5 % — ABNORMAL HIGH (ref 0.0–0.2)

## 2023-09-26 LAB — APTT: aPTT: 29 s (ref 24–36)

## 2023-09-26 LAB — GLUCOSE, CAPILLARY
Glucose-Capillary: 146 mg/dL — ABNORMAL HIGH (ref 70–99)
Glucose-Capillary: 443 mg/dL — ABNORMAL HIGH (ref 70–99)

## 2023-09-26 LAB — MRSA NEXT GEN BY PCR, NASAL: MRSA by PCR Next Gen: DETECTED — AB

## 2023-09-26 LAB — LACTIC ACID, PLASMA: Lactic Acid, Venous: 1.5 mmol/L (ref 0.5–1.9)

## 2023-09-26 MED ORDER — METHYLPREDNISOLONE SODIUM SUCC 125 MG IJ SOLR
80.0000 mg | Freq: Two times a day (BID) | INTRAMUSCULAR | Status: DC
  Administered 2023-09-26: 80 mg via INTRAVENOUS
  Filled 2023-09-26: qty 2

## 2023-09-26 MED ORDER — OXYCODONE HCL 5 MG PO TABS: 10.0000 mg | ORAL_TABLET | Freq: Four times a day (QID) | ORAL | Status: DC

## 2023-09-26 MED ORDER — COLCHICINE 0.6 MG PO TABS: 0.6000 mg | ORAL_TABLET | Freq: Every day | ORAL | Status: DC

## 2023-09-26 MED ORDER — MUPIROCIN 2 % EX OINT
1.0000 | TOPICAL_OINTMENT | Freq: Two times a day (BID) | CUTANEOUS | Status: DC
  Administered 2023-09-26 – 2023-09-27 (×2): 1 via NASAL
  Filled 2023-09-26: qty 22

## 2023-09-26 MED ORDER — FOLIC ACID 1 MG PO TABS
1.0000 mg | ORAL_TABLET | Freq: Every day | ORAL | Status: DC
  Administered 2023-09-26 – 2023-09-27 (×2): 1 mg via ORAL
  Filled 2023-09-26 (×2): qty 1

## 2023-09-26 MED ORDER — HYDRALAZINE HCL 20 MG/ML IJ SOLN
10.0000 mg | Freq: Four times a day (QID) | INTRAMUSCULAR | Status: DC | PRN
  Administered 2023-09-26: 10 mg via INTRAVENOUS
  Filled 2023-09-26: qty 1

## 2023-09-26 MED ORDER — ACETAMINOPHEN 650 MG RE SUPP: 650.0000 mg | Freq: Four times a day (QID) | RECTAL | Status: DC | PRN

## 2023-09-26 MED ORDER — SODIUM CHLORIDE 0.9 % IV SOLN: 200.0000 mg | Freq: Once | INTRAVENOUS | Status: DC

## 2023-09-26 MED ORDER — MOMETASONE FURO-FORMOTEROL FUM 200-5 MCG/ACT IN AERO
2.0000 | INHALATION_SPRAY | Freq: Two times a day (BID) | RESPIRATORY_TRACT | Status: DC
  Administered 2023-09-26 – 2023-09-27 (×2): 2 via RESPIRATORY_TRACT
  Filled 2023-09-26: qty 8.8

## 2023-09-26 MED ORDER — SERTRALINE HCL 50 MG PO TABS
100.0000 mg | ORAL_TABLET | Freq: Every day | ORAL | Status: DC
  Administered 2023-09-26 – 2023-09-27 (×2): 100 mg via ORAL
  Filled 2023-09-26 (×2): qty 2

## 2023-09-26 MED ORDER — FERROUS SULFATE 325 (65 FE) MG PO TABS
325.0000 mg | ORAL_TABLET | Freq: Every day | ORAL | Status: DC
  Administered 2023-09-26: 325 mg via ORAL
  Filled 2023-09-26: qty 1

## 2023-09-26 MED ORDER — INDOMETHACIN 25 MG PO CAPS
50.0000 mg | ORAL_CAPSULE | Freq: Three times a day (TID) | ORAL | Status: DC
  Administered 2023-09-26 – 2023-09-27 (×2): 50 mg via ORAL
  Filled 2023-09-26 (×8): qty 2

## 2023-09-26 MED ORDER — GLIPIZIDE ER 2.5 MG PO TB24
2.5000 mg | ORAL_TABLET | Freq: Every day | ORAL | Status: DC
Start: 2023-09-27 — End: 2023-09-27
  Filled 2023-09-26 (×4): qty 1

## 2023-09-26 MED ORDER — ALBUTEROL SULFATE HFA 108 (90 BASE) MCG/ACT IN AERS
2.0000 | INHALATION_SPRAY | RESPIRATORY_TRACT | Status: DC | PRN
  Administered 2023-09-26 – 2023-09-27 (×2): 2 via RESPIRATORY_TRACT
  Filled 2023-09-26: qty 6.7

## 2023-09-26 MED ORDER — SODIUM CHLORIDE 0.9 % IV SOLN
100.0000 mg | INTRAVENOUS | Status: AC
  Administered 2023-09-26 (×2): 100 mg via INTRAVENOUS
  Filled 2023-09-26 (×2): qty 20

## 2023-09-26 MED ORDER — ONDANSETRON HCL 4 MG/2ML IJ SOLN: 4.0000 mg | Freq: Four times a day (QID) | INTRAMUSCULAR | Status: DC | PRN

## 2023-09-26 MED ORDER — LINAGLIPTIN 5 MG PO TABS
5.0000 mg | ORAL_TABLET | Freq: Every day | ORAL | Status: DC
Start: 2023-09-26 — End: 2023-09-27
  Administered 2023-09-26 – 2023-09-27 (×2): 5 mg via ORAL
  Filled 2023-09-26 (×2): qty 1

## 2023-09-26 MED ORDER — CYCLOBENZAPRINE HCL 10 MG PO TABS
10.0000 mg | ORAL_TABLET | Freq: Three times a day (TID) | ORAL | Status: DC
  Administered 2023-09-26 – 2023-09-27 (×2): 10 mg via ORAL
  Filled 2023-09-26 (×2): qty 1

## 2023-09-26 MED ORDER — ACETAMINOPHEN 325 MG PO TABS
650.0000 mg | ORAL_TABLET | Freq: Four times a day (QID) | ORAL | Status: DC | PRN
Start: 2023-09-26 — End: 2023-09-27
  Administered 2023-09-26: 650 mg via ORAL
  Filled 2023-09-26: qty 2

## 2023-09-26 MED ORDER — FENOFIBRATE 54 MG PO TABS
54.0000 mg | ORAL_TABLET | Freq: Every day | ORAL | Status: DC
  Filled 2023-09-26 (×3): qty 1

## 2023-09-26 MED ORDER — MONTELUKAST SODIUM 10 MG PO TABS
10.0000 mg | ORAL_TABLET | Freq: Every day | ORAL | Status: DC
  Administered 2023-09-26: 10 mg via ORAL
  Filled 2023-09-26: qty 1

## 2023-09-26 MED ORDER — SODIUM CHLORIDE 0.9 % IV SOLN
100.0000 mg | Freq: Every day | INTRAVENOUS | Status: DC
  Administered 2023-09-27: 100 mg via INTRAVENOUS
  Filled 2023-09-26 (×2): qty 20

## 2023-09-26 MED ORDER — VITAMIN C 500 MG PO TABS
500.0000 mg | ORAL_TABLET | Freq: Every day | ORAL | Status: DC
  Administered 2023-09-26 – 2023-09-27 (×2): 500 mg via ORAL
  Filled 2023-09-26 (×2): qty 1

## 2023-09-26 MED ORDER — OXYCODONE HCL 5 MG PO TABS
10.0000 mg | ORAL_TABLET | Freq: Four times a day (QID) | ORAL | Status: DC
  Administered 2023-09-26 – 2023-09-27 (×4): 10 mg via ORAL
  Filled 2023-09-26 (×4): qty 2

## 2023-09-26 MED ORDER — ATENOLOL 25 MG PO TABS
25.0000 mg | ORAL_TABLET | Freq: Every day | ORAL | Status: DC
  Administered 2023-09-26 – 2023-09-27 (×2): 25 mg via ORAL
  Filled 2023-09-26 (×2): qty 1

## 2023-09-26 MED ORDER — ONDANSETRON HCL 4 MG PO TABS
4.0000 mg | ORAL_TABLET | Freq: Four times a day (QID) | ORAL | Status: DC | PRN
  Administered 2023-09-26: 4 mg via ORAL
  Filled 2023-09-26: qty 1

## 2023-09-26 MED ORDER — INSULIN GLARGINE-YFGN 100 UNIT/ML ~~LOC~~ SOLN
68.0000 [IU] | Freq: Two times a day (BID) | SUBCUTANEOUS | Status: DC
  Administered 2023-09-26 – 2023-09-27 (×2): 68 [IU] via SUBCUTANEOUS
  Filled 2023-09-26 (×5): qty 0.68

## 2023-09-26 MED ORDER — PANTOPRAZOLE SODIUM 40 MG PO TBEC
40.0000 mg | DELAYED_RELEASE_TABLET | Freq: Every day | ORAL | Status: DC
Start: 2023-09-26 — End: 2023-09-27
  Administered 2023-09-26 – 2023-09-27 (×2): 40 mg via ORAL
  Filled 2023-09-26 (×2): qty 1

## 2023-09-26 MED ORDER — CHLORHEXIDINE GLUCONATE CLOTH 2 % EX PADS: 6.0000 | MEDICATED_PAD | Freq: Every day | CUTANEOUS | Status: DC

## 2023-09-26 MED ORDER — SERTRALINE HCL 50 MG PO TABS: 50.0000 mg | ORAL_TABLET | Freq: Every day | ORAL | Status: DC

## 2023-09-26 MED ORDER — IBUPROFEN 400 MG PO TABS: 800.0000 mg | ORAL_TABLET | Freq: Two times a day (BID) | ORAL | Status: DC | PRN

## 2023-09-26 MED ORDER — INSULIN ASPART 100 UNIT/ML IJ SOLN
30.0000 [IU] | Freq: Three times a day (TID) | INTRAMUSCULAR | Status: DC
  Administered 2023-09-27 (×2): 30 [IU] via SUBCUTANEOUS

## 2023-09-26 MED ORDER — INSULIN ASPART 100 UNIT/ML IJ SOLN
0.0000 [IU] | Freq: Three times a day (TID) | INTRAMUSCULAR | Status: DC
  Administered 2023-09-26: 3 [IU] via SUBCUTANEOUS
  Administered 2023-09-27 (×2): 7 [IU] via SUBCUTANEOUS

## 2023-09-26 MED ORDER — SODIUM CHLORIDE 0.9 % IV SOLN: 100.0000 mg | Freq: Every day | INTRAVENOUS | Status: DC

## 2023-09-26 MED ORDER — ENOXAPARIN SODIUM 40 MG/0.4ML IJ SOSY: 40.0000 mg | PREFILLED_SYRINGE | INTRAMUSCULAR | Status: DC

## 2023-09-26 MED ORDER — INSULIN ASPART 100 UNIT/ML IJ SOLN
0.0000 [IU] | Freq: Every day | INTRAMUSCULAR | Status: DC
  Administered 2023-09-26: 5 [IU] via SUBCUTANEOUS

## 2023-09-26 MED ORDER — DOCUSATE SODIUM 100 MG PO CAPS
100.0000 mg | ORAL_CAPSULE | Freq: Two times a day (BID) | ORAL | Status: DC
  Administered 2023-09-26 – 2023-09-27 (×2): 100 mg via ORAL
  Filled 2023-09-26 (×2): qty 1

## 2023-09-26 MED ORDER — ENOXAPARIN SODIUM 80 MG/0.8ML IJ SOSY
80.0000 mg | PREFILLED_SYRINGE | INTRAMUSCULAR | Status: DC
  Administered 2023-09-26: 80 mg via SUBCUTANEOUS
  Filled 2023-09-26: qty 0.8

## 2023-09-26 MED ORDER — PROMETHAZINE HCL 12.5 MG PO TABS
25.0000 mg | ORAL_TABLET | Freq: Two times a day (BID) | ORAL | Status: DC
Start: 2023-09-26 — End: 2023-09-27
  Administered 2023-09-26 – 2023-09-27 (×2): 25 mg via ORAL
  Filled 2023-09-26 (×2): qty 2

## 2023-09-26 MED ORDER — BUPROPION HCL ER (XL) 150 MG PO TB24
300.0000 mg | ORAL_TABLET | Freq: Every day | ORAL | Status: DC
Start: 2023-09-27 — End: 2023-09-27
  Administered 2023-09-27: 300 mg via ORAL
  Filled 2023-09-26: qty 2

## 2023-09-26 MED ORDER — FUROSEMIDE 40 MG PO TABS
40.0000 mg | ORAL_TABLET | Freq: Every day | ORAL | Status: DC
Start: 2023-09-27 — End: 2023-09-27
  Administered 2023-09-27: 40 mg via ORAL
  Filled 2023-09-26: qty 1

## 2023-09-26 MED ORDER — GABAPENTIN 300 MG PO CAPS
600.0000 mg | ORAL_CAPSULE | Freq: Three times a day (TID) | ORAL | Status: DC
  Administered 2023-09-26 – 2023-09-27 (×3): 600 mg via ORAL
  Filled 2023-09-26 (×3): qty 2

## 2023-09-26 MED ORDER — GUAIFENESIN-DM 100-10 MG/5ML PO SYRP
5.0000 mL | ORAL_SOLUTION | ORAL | Status: DC | PRN
  Administered 2023-09-26 – 2023-09-27 (×3): 5 mL via ORAL
  Filled 2023-09-26 (×3): qty 5

## 2023-09-26 MED ORDER — SIMVASTATIN 20 MG PO TABS
40.0000 mg | ORAL_TABLET | Freq: Every evening | ORAL | Status: DC
Start: 2023-09-26 — End: 2023-09-27
  Administered 2023-09-26: 40 mg via ORAL

## 2023-09-26 MED ORDER — ASPIRIN 81 MG PO TBEC
81.0000 mg | DELAYED_RELEASE_TABLET | Freq: Every day | ORAL | Status: DC
  Administered 2023-09-27: 81 mg via ORAL
  Filled 2023-09-26: qty 1

## 2023-09-26 MED ORDER — ESTROGENS CONJUGATED 0.625 MG PO TABS
0.6250 mg | ORAL_TABLET | Freq: Every day | ORAL | Status: DC
  Filled 2023-09-26 (×3): qty 1

## 2023-09-26 NOTE — ED Notes (Signed)
ED TO INPATIENT HANDOFF REPORT  ED Nurse Name and Phone #: Bonita Quin [phone]  S Name/Age/Gender Jennifer Odonnell 58 y.o. female Room/Bed: APA06/APA06  Code Status   Code Status: Prior  Home/SNF/Other Home Patient oriented to: self, place, time, and situation Is this baseline? Yes   Triage Complete: Triage complete  Chief Complaint Acute on chronic hypoxic respiratory failure (HCC) [J96.21]  Triage Note Pt c/o SOB, congestion, cough, sore throat, headache, nausea and increased body aches from chronic. Pt normally is on 6 lpm  nasal cannula but she puts herself on 8 lpm after walking. Pt denies diarrhea or chest pain.    Allergies Allergies  Allergen Reactions   Daucus Carota Swelling    Raw carrots   Lisinopril Anaphylaxis   Tiotropium Swelling   Erythromycin Diarrhea   Ambien [Zolpidem] Other (See Comments)    Woke up disoriented and scared.   Spiriva Handihaler [Tiotropium Bromide Monohydrate]     Level of Care/Admitting Diagnosis ED Disposition     ED Disposition  Admit   Condition  --   Comment  Hospital Area: [facility] [[id number]]  Level of Care: Stepdown [14]  Covid Evaluation: Confirmed COVID Positive  Diagnosis: Acute on chronic hypoxic respiratory failure [facility]) [[id number]]  Admitting Physician: Erick Blinks [[id number]]  Attending Physician: Erick Blinks [[id number]]  Certification:: I certify this patient will need inpatient services for at least 2 midnights  Expected Medical Readiness: 09/29/2023          B Medical/Surgery History Past Medical History:  Diagnosis Date   Arthritis    Cancer (HCC)    cervical cancer at age 69   COPD (chronic obstructive pulmonary disease) (HCC)    CREST syndrome (HCC)    Degenerative disorder of bone    Diabetes mellitus without complication (HCC)    Hypertension    Neuropathy    Requires continuous at home supplemental oxygen    Past Surgical History:  Procedure Laterality Date   ABDOMINAL  HYSTERECTOMY     BLADDER EXTROPHY RECONSTRUCTION PELVIC SAGITTAL OSTEOTOMY  2020   KNEE ARTHROSCOPY WITH PATELLA RECONSTRUCTION Bilateral    PARTIAL HYSTERECTOMY  07/2014   TONSILLECTOMY       A IV Location/Drains/Wounds Patient Lines/Drains/Airways Status     Active Line/Drains/Airways     Name Placement date Placement time Site Days   Peripheral IV 09/26/23 20 G Anterior;Right Forearm 09/26/23  1128  Forearm  less than 1            Intake/Output Last 24 hours No intake or output data in the 24 hours ending 09/26/23 1225  Labs/Imaging Results for orders placed or performed during the hospital encounter of 09/26/23 (from the past 48 hours)  Resp panel by RT-PCR (RSV, Flu A&B, Covid) Anterior Nasal Swab     Status: Abnormal   Collection Time: 09/26/23 11:01 AM   Specimen: Anterior Nasal Swab  Result Value Ref Range   SARS Coronavirus 2 by RT PCR POSITIVE (A) NEGATIVE    Comment: (NOTE) SARS-CoV-2 target nucleic acids are DETECTED.  The SARS-CoV-2 RNA is generally detectable in upper respiratory specimens during the acute phase of infection. Positive results are indicative of the presence of the identified virus, but do not rule out bacterial infection or co-infection with other pathogens not detected by the test. Clinical correlation with patient history and other diagnostic information is necessary to determine patient infection status. The expected result is Negative.  Fact Sheet for Patients: BloggerCourse.com  Fact Sheet for  Healthcare Providers: SeriousBroker.it  This test is not yet approved or cleared by the [place] and  has been authorized for detection and/or diagnosis of SARS-CoV-2 by FDA under an Emergency Use Authorization (EUA).  This EUA will remain in effect (meaning this test can be used) for the duration of  the COVID-19 declaration under Section 564(b)(1) of the A ct, 21 U.S.C. section  360bbb-3(b)(1), unless the authorization is terminated or revoked sooner.     Influenza A by PCR NEGATIVE NEGATIVE   Influenza B by PCR NEGATIVE NEGATIVE    Comment: (NOTE) The Xpert Xpress SARS-CoV-2/FLU/RSV plus assay is intended as an aid in the diagnosis of influenza from Nasopharyngeal swab specimens and should not be used as a sole basis for treatment. Nasal washings and aspirates are unacceptable for Xpert Xpress SARS-CoV-2/FLU/RSV testing.  Fact Sheet for Patients: BloggerCourse.com  Fact Sheet for Healthcare Providers: SeriousBroker.it  This test is not yet approved or cleared by the [place] FDA and has been authorized for detection and/or diagnosis of SARS-CoV-2 by FDA under an Emergency Use Authorization (EUA). This EUA will remain in effect (meaning this test can be used) for the duration of the COVID-19 declaration under Section 564(b)(1) of the Act, 21 U.S.C. section 360bbb-3(b)(1), unless the authorization is terminated or revoked.     Resp Syncytial Virus by PCR NEGATIVE NEGATIVE    Comment: (NOTE) Fact Sheet for Patients: BloggerCourse.com  Fact Sheet for Healthcare Providers: SeriousBroker.it  This test is not yet approved or cleared by the [place] FDA and has been authorized for detection and/or diagnosis of SARS-CoV-2 by FDA under an Emergency Use Authorization (EUA). This EUA will remain in effect (meaning this test can be used) for the duration of the COVID-19 declaration under Section 564(b)(1) of the Act, 21 U.S.C. section 360bbb-3(b)(1), unless the authorization is terminated or revoked.  Performed at [facility], [address] [place], [place] [address]   Lactic acid, plasma     Status: None   Collection Time: 09/26/23 11:37 AM  Result Value Ref Range   Lactic Acid, Venous 1.5 0.5 - 1.9 mmol/L    Comment: Performed at [facility], [address], [place], [place] [address]  Comprehensive metabolic panel     Status: Abnormal   Collection Time: 09/26/23 11:37 AM  Result Value Ref Range   Sodium 141 135 - 145 mmol/L   Potassium 4.3 3.5 - 5.1 mmol/L   Chloride 91 (L) 98 - 111 mmol/L   CO2 39 (H) 22 - 32 mmol/L   Glucose, Bld 153 (H) 70 - 99 mg/dL    Comment: Glucose reference range applies only to samples taken after fasting for at least 8 hours.   BUN 25 (H) 6 - 20 mg/dL   Creatinine, Ser 1.30 0.44 - 1.00 mg/dL   Calcium 9.3 8.9 - 86.5 mg/dL   Total Protein 6.8 6.5 - 8.1 g/dL   Albumin 3.1 (L) 3.5 - 5.0 g/dL   AST 30 15 - 41 U/L   ALT 43 0 - 44 U/L   Alkaline Phosphatase 63 38 - 126 U/L   Total Bilirubin 0.4 0.0 - 1.2 mg/dL   GFR, Estimated >78 >46 mL/min    Comment: (NOTE) Calculated using the CKD-EPI Creatinine Equation (2021)    Anion gap 11 5 - 15    Comment: Performed at [facility], [address] [place], [place] [address]  CBC with Differential     Status: Abnormal   Collection Time:  09/26/23 11:37 AM  Result Value Ref Range   WBC 8.1 4.0 - 10.5 K/uL   RBC 2.94 (L) 3.87 - 5.11 MIL/uL   Hemoglobin 9.1 (L) 12.0 - 15.0 g/dL   HCT 16.1 (L) 09.6 - 04.5 %   MCV 101.0 (H) 80.0 - 100.0 fL   MCH 31.0 26.0 - 34.0 pg   MCHC 30.6 30.0 - 36.0 g/dL   RDW 40.9 81.1 - 91.4 %   Platelets 245 150 - 400 K/uL   nRBC 0.5 (H) 0.0 - 0.2 %   Neutrophils Relative % 69 %   Neutro Abs 5.6 1.7 - 7.7 K/uL   Lymphocytes Relative 19 %   Lymphs Abs 1.6 0.7 - 4.0 K/uL   Monocytes Relative 5 %   Monocytes Absolute 0.4 0.1 - 1.0 K/uL   Eosinophils Relative 1 %   Eosinophils Absolute 0.1 0.0 - 0.5 K/uL   Basophils Relative 1 %   Basophils Absolute 0.0 0.0 - 0.1 K/uL   Immature Granulocytes 5 %   Abs Immature Granulocytes 0.40 (H) 0.00 - 0.07 K/uL    Comment: Performed at [facility], [address], [place], [place] [address]  Protime-INR     Status: None   Collection Time: 09/26/23 11:37 AM  Result Value Ref Range    Prothrombin Time 12.1 11.4 - 15.2 seconds   INR 0.9 0.8 - 1.2    Comment: (NOTE) INR goal varies based on device and disease states. Performed at [facility], [address], [place], [place] [address]   APTT     Status: None   Collection Time: 09/26/23 11:37 AM  Result Value Ref Range   aPTT 29 24 - 36 seconds    Comment: Performed at [facility], [address] [place], [place] [address]  Blood Culture (routine x 2)     Status: None (Preliminary result)   Collection Time: 09/26/23 11:37 AM   Specimen: Blood  Result Value Ref Range   Specimen Description BLOOD BLOOD RIGHT ARM    Special Requests      BOTTLES DRAWN AEROBIC AND ANAEROBIC Blood Culture adequate volume Performed at [facility], [address] [place], [place] [address]    Culture PENDING    Report Status PENDING   Blood Culture (routine x 2)     Status: None (Preliminary result)   Collection Time: 09/26/23 11:37 AM   Specimen: Blood  Result Value Ref Range   Specimen Description BLOOD BLOOD LEFT HAND    Special Requests      BOTTLES DRAWN AEROBIC AND ANAEROBIC Blood Culture adequate volume Performed at [facility], [address] [place], [place] [address]    Culture PENDING    Report Status PENDING    DG Chest 2 View Result Date: 09/26/2023 CLINICAL DATA:  Shortness of breath.  Hypoxia. EXAM: CHEST - 2 VIEW COMPARISON:  09/07/2023 FINDINGS: Improved aeration of both lungs is seen. Mild atelectasis or scarring again seen at right lung base. No No evidence of focal consolidation or pleural effusion. IMPRESSION: Improved aeration of both lungs. Mild right basilar atelectasis versus scarring. Electronically Signed   By: Danae Orleans M.D.   On: 09/26/2023 11:30    Pending Labs Unresulted Labs (From admission, onward)     Start     Ordered   09/26/23 1057  Lactic acid, plasma  (Undifferentiated presentation (screening labs and basic nursing orders))  STAT Now then every 2 hours,   R (with STAT occurrences)  09/26/23 1056   09/26/23 1057  Urinalysis, w/ Reflex to Culture (Infection Suspected) -Urine, Clean Catch  (Undifferentiated presentation (screening labs and basic nursing orders))  ONCE - URGENT,   URGENT       Question:  Specimen Source  Answer:  Urine, Clean Catch   09/26/23 1056            Vitals/Pain Today's Vitals   09/26/23 1038 09/26/23 1041 09/26/23 1044 09/26/23 1049  BP:    (!) 168/113  Pulse:   80   Resp:   19   Temp:  98.6 F (37 C)    TempSrc:  Oral    SpO2:   97%   Weight: (!) 158.8 kg     Height: 5\' 8"  (1.727 m)     PainSc:        Isolation Precautions Airborne and Contact precautions  Medications Medications  albuterol (VENTOLIN HFA) 108 (90 Base) MCG/ACT inhaler 2 puff (2 puffs Inhalation Given 09/26/23 1211)  remdesivir 200 mg in sodium chloride 0.9% 250 mL IVPB (has no administration in time range)    Followed by  remdesivir 100 mg in sodium chloride 0.9 % 100 mL IVPB (has no administration in time range)  methylPREDNISolone sodium succinate (SOLU-MEDROL) 125 mg/2 mL injection 80 mg (has no administration in time range)    Mobility walks     Focused Assessments No wheezing noted to lungs. Pt becomes SOB with activity but does not get SOB while talking with no activity.    R Recommendations: See Admitting Provider Note  Report given to:   Additional Notes: A&O x 4,  6 LPM, Does not want bp on upper arm, stated she could not hear due to congestion,

## 2023-09-26 NOTE — ED Provider Notes (Signed)
EMERGENCY DEPARTMENT AT [facility] Provider Note   CSN: [id number] Arrival date & time: 09/26/23  1009     History  Chief Complaint  Patient presents with   Shortness of Breath    Jennifer Odonnell is a 58 y.o. female.  HPI Adult female multiple medical issues, including chronic oxygen dependent COPD scleroderma presents with weakness, dyspnea, PE.  Patient notes that she has been seen, evaluated here, is currently taking doxycycline, has not improved in spite of taking all of her medication as prescribed.  She has not seen her physician spoke to her medications, in requiring additional oxygen.    Home Medications Prior to Admission medications   Medication Sig Start Date End Date Taking? Authorizing Provider  ADVAIR DISKUS 250-50 MCG/ACT AEPB 1 puff 2 (two) times daily. 05/07/23   [provider]  albuterol (PROVENTIL HFA;VENTOLIN HFA) 108 (90 BASE) MCG/ACT inhaler Inhale 2 puffs into the lungs every 6 (six) hours as needed for wheezing or shortness of breath.    [provider]  albuterol (PROVENTIL) (2.5 MG/3ML) 0.083% nebulizer solution Take 3 mLs (2.5 mg total) by nebulization every 4 (four) hours as needed for wheezing or shortness of breath. 09/11/20   Johnson, [name], MD  ascorbic acid (VITAMIN C) 500 MG tablet Take 500 mg by mouth daily.    [provider]  aspirin 81 MG EC tablet Take 81 mg by mouth daily.    [provider]  atenolol (TENORMIN) 25 MG tablet Take 25 mg by mouth daily. 08/14/20   [provider]  buPROPion (WELLBUTRIN XL) 300 MG 24 hr tablet Take 300 mg by mouth daily. 05/07/23   [provider]  carisoprodol (SOMA) 350 MG tablet Take 350 mg by mouth 3 (three) times daily. 08/04/20   [provider]  colchicine 0.6 MG tablet Take 0.6 mg by mouth daily. 09/04/20   [provider]  cyclobenzaprine (FLEXERIL) 10 MG tablet Take 10 mg by mouth 3 (three) times daily.  05/07/23   [provider]  docusate sodium (COLACE) 100 MG capsule Take 100 mg by mouth 2 (two) times daily.    [provider]  fenofibrate (TRICOR) 48 MG tablet Take 48 mg by mouth daily. 08/14/20   [provider]  FEROSUL 325 (65 Fe) MG tablet Take 325 mg by mouth daily. 08/15/20   [provider]  folic acid (FOLVITE) 1 MG tablet Take 1 mg by mouth daily. 08/14/20   [provider]  furosemide (LASIX) 40 MG tablet Take 1 tablet (40 mg total) by mouth daily. 09/07/23   Johnson, [name], MD  gabapentin (NEURONTIN) 600 MG tablet Take 600 mg by mouth 3 (three) times daily. 04/15/23   [provider]  glipiZIDE (GLUCOTROL XL) 2.5 MG 24 hr tablet Take 2.5 mg by mouth every morning. 08/14/20   [provider]  HUMULIN R 100 UNIT/ML injection Inject 30 Units into the skin 3 (three) times daily before meals. 05/08/23   [provider]  hydroxychloroquine (PLAQUENIL) 200 MG tablet Take 400 mg by mouth daily. 08/14/20   [provider]  ibuprofen (ADVIL) 800 MG tablet Take 800 mg by mouth 2 (two) times daily as needed. 05/07/23   [provider]  indomethacin (INDOCIN) 50 MG capsule Take 50 mg by mouth 3 (three) times daily. 05/07/23   [provider]  ipratropium-albuterol (DUONEB) 0.5-2.5 (3) MG/3ML SOLN Take 3 mLs by nebulization 3 (three) times daily. 08/14/20  [provider]  LANTUS SOLOSTAR 100 UNIT/ML Solostar Pen Inject 68 Units into the skin 2 (two) times daily. 09/04/20   [provider]  losartan-hydrochlorothiazide (HYZAAR) 100-12.5 MG tablet Take 1 tablet by mouth daily. 08/14/20   [provider]  montelukast (SINGULAIR) 10 MG tablet Take 10 mg by mouth daily. 05/07/23   [provider]  Multiple Vitamins-Calcium (ONE-A-DAY WOMENS FORMULA) TABS Take 1 tablet by mouth daily.    [provider]  nystatin (MYCOSTATIN/NYSTOP) powder Apply topically 2 (two) times  daily. 02/15/23   [provider]  nystatin cream (MYCOSTATIN) Apply topically 2 (two) times daily. 05/07/23   [provider]  nystatin ointment (MYCOSTATIN) Apply 1 application topically 2 (two) times daily.    [provider]  omeprazole (PRILOSEC) 40 MG capsule Take 40 mg by mouth daily. 08/15/20   [provider]  ondansetron (ZOFRAN-ODT) 4 MG disintegrating tablet Take 4 mg by mouth 3 (three) times daily as needed. 06/02/23   [provider]  Oxycodone HCl 10 MG TABS Take 10 mg by mouth 4 (four) times daily. 08/25/20   [provider]  predniSONE (DELTASONE) 20 MG tablet Take 2 tablets (40 mg total) by mouth daily with breakfast. Take 2 PO QAM x5 days 09/07/23   [name], [name], MD  PREMARIN 0.625 MG tablet Take 0.625 mg by mouth daily. 08/14/20   [provider]  promethazine (PHENERGAN) 25 MG tablet Take 25 mg by mouth every 12 (twelve) hours.    [provider]  promethazine-dextromethorphan (PROMETHAZINE-DM) 6.25-15 MG/5ML syrup Take 5 mLs by mouth 3 (three) times daily as needed. 06/02/23   [provider]  sertraline (ZOLOFT) 50 MG tablet Take 50 mg by mouth daily. 05/08/23   [provider]  simvastatin (ZOCOR) 40 MG tablet Take 40 mg by mouth every evening. 08/14/20   [provider]  sitaGLIPtin (JANUVIA) 100 MG tablet Take 100 mg by mouth daily.    [provider]  Vitamin D, Ergocalciferol, (DRISDOL) 1.25 MG (50000 UNIT) CAPS capsule Take 50,000 Units by mouth once a week. 08/14/20   [provider]      Allergies    Daucus carota, Lisinopril, Tiotropium, Erythromycin, Ambien [zolpidem], and Spiriva handihaler [tiotropium bromide monohydrate]    Review of Systems   Review of Systems  Physical Exam Updated Vital Signs BP (!) 168/113   Pulse 80   Temp 98.6 F (37 C) (Oral)   Resp 19   Ht 5\' 8"  (1.727 m)   Wt (!) 158.8 kg   SpO2 97%   BMI 53.22 kg/m  Physical  Exam Vitals and nursing note reviewed.  Constitutional:      General: She is not in acute distress.    Appearance: She is well-developed. She is obese. She is ill-appearing.  HENT:     Head: Normocephalic and atraumatic.  Eyes:     Conjunctiva/sclera: Conjunctivae normal.  Cardiovascular:     Rate and Rhythm: Regular rhythm.  Pulmonary:     Effort: Pulmonary effort is normal. Tachypnea present.     Breath sounds: Decreased breath sounds present. No wheezing.  Abdominal:     General: There is no distension.  Skin:    General: Skin is warm and dry.  Neurological:     Mental Status: She is alert and oriented to person, place, and time.     Cranial Nerves: No cranial nerve deficit.  Psychiatric:        Mood and Affect: Mood  normal.     ED Results / Procedures / Treatments   Labs (all labs ordered are listed, but only abnormal results are displayed) Labs Reviewed - No data to display  EKG None  Radiology No results found.  Procedures Procedures    Medications Ordered in ED Medications  albuterol (VENTOLIN HFA) 108 (90 Base) MCG/ACT inhaler 2 puff (has no administration in time range)    ED Course/ Medical Decision Making/ A&P                                 Medical Decision Making Obese adult female with chronic respiratory failure presents with fatigue, dyspnea.  Broad differential was profile including obesity, chronic respiratory failure, connective tissue disorder, opiate use pneumonia, COPD, bacteremia, sepsis, flu Cardiac 80 sinus normal Pulse ox 97% nasal cannula abnormal  Amount and/or Complexity of Data Reviewed Independent Historian: spouse External Data Reviewed: notes. Labs: ordered. Decision-making details documented in ED Course. Radiology: ordered and independent interpretation performed. Decision-making details documented in ED Course. ECG/medicine tests: ordered and independent interpretation performed. Decision-making details documented in ED  Course.  Risk Prescription drug management. Decision regarding hospitalization. Diagnosis or treatment significantly limited by social determinants of health.   Update: Patient now back to 6 L nasal cannula after needing 8 L then 10 L transiently.  She and I discussed today's findings, notable for COVID-positive result, worsening oxygen requirement, the context of ongoing doxycycline and oral steroid use, concern for acute hypoxic respiratory compromise secondary to COVID infection.  Patient's other labs, x-ray reviewed, generally noncontributory.  Given COVID, increased oxygen requirement, innumerable comorbidities, patient admitted for further monitoring, management.        Final Clinical Impression(s) / ED Diagnoses Final diagnoses:  COVID     Gerhard Munch, MD 09/26/23 1254

## 2023-09-26 NOTE — ED Triage Notes (Signed)
Pt c/o SOB, congestion, cough, sore throat, headache, nausea and increased body aches from chronic. Pt normally is on 6 lpm  nasal cannula but she puts herself on 8 lpm after walking. Pt denies diarrhea or chest pain.

## 2023-09-26 NOTE — H&P (Signed)
History and Physical    Jennifer Odonnell QMV:[id number] DOB: 11/07/1965 DOA: 09/26/2023  PCP: Aggie Cosier, MD   Patient coming from: Home  Chief Complaint: Shortness of breath  HPI: Jennifer Odonnell is a 58 y.o. female with medical history significant for COPD with chronic hypoxemia on 6 L nasal cannula at home, obesity hypoventilation syndrome, type 2 diabetes, hypertension, CREST syndrome, and morbid obesity who presented to the ED with worsening shortness of breath.  She was recently seen in the ED 1/5 and was thought to have some pulmonary edema as well as multifocal pneumonia on chest x-ray.  She was given some IV Lasix and sent home with some prednisone as well as doxycycline for 5 days.  She states that despite taking these medications, she has not improved.   ED Course: Vital signs demonstrating elevated blood pressure readings and hemoglobin 9.1.  Chest x-ray with some mild bilateral atelectasis noted.  She is noted to be COVID-positive and initially was requiring 10 L nasal cannula, but is currently on her baseline 6 L.  Review of Systems: Reviewed as noted above, otherwise negative.  Past Medical History:  Diagnosis Date   Arthritis    Cancer (HCC)    cervical cancer at age 89   COPD (chronic obstructive pulmonary disease) (HCC)    CREST syndrome (HCC)    Degenerative disorder of bone    Diabetes mellitus without complication (HCC)    Hypertension    Neuropathy    Requires continuous at home supplemental oxygen     Past Surgical History:  Procedure Laterality Date   ABDOMINAL HYSTERECTOMY     BLADDER EXTROPHY RECONSTRUCTION PELVIC SAGITTAL OSTEOTOMY  2020   KNEE ARTHROSCOPY WITH PATELLA RECONSTRUCTION Bilateral    PARTIAL HYSTERECTOMY  07/2014   TONSILLECTOMY       reports that she has quit smoking. Her smoking use included cigarettes. She has a 30 pack-year smoking history. She has never used smokeless tobacco. She reports that she does not  currently use alcohol. She reports that she does not currently use drugs.  Allergies  Allergen Reactions   Daucus Carota Swelling    Raw carrots   Lisinopril Anaphylaxis   Tiotropium Swelling   Erythromycin Diarrhea   Ambien [Zolpidem] Other (See Comments)    Woke up disoriented and scared.   Spiriva Handihaler [Tiotropium Bromide Monohydrate]     Family History  Problem Relation Age of Onset   Asthma Mother    Emphysema Mother    Arthritis Mother    Hyperlipidemia Mother    Diabetes Mother    Hypertension Mother    Heart disease Father    Heart attack Father    Cancer Father        throat   Hyperlipidemia Father    Alcohol abuse Father    Arthritis Father    Asthma Sister    Emphysema Sister    Seizures Sister    Hypertension Sister    Arthritis Brother    Hypertension Brother    Thyroid disease Daughter     Prior to Admission medications   Medication Sig Start Date End Date Taking? Authorizing Provider  ADVAIR DISKUS 250-50 MCG/ACT AEPB 1 puff 2 (two) times daily. 05/07/23   [provider]  albuterol (PROVENTIL HFA;VENTOLIN HFA) 108 (90 BASE) MCG/ACT inhaler Inhale 2 puffs into the lungs every 6 (six) hours as needed for wheezing or shortness of breath.    [provider]  albuterol (PROVENTIL) (2.5 MG/3ML) 0.083% nebulizer solution  Take 3 mLs (2.5 mg total) by nebulization every 4 (four) hours as needed for wheezing or shortness of breath. 09/11/20   Johnson, [name], MD  ascorbic acid (VITAMIN C) 500 MG tablet Take 500 mg by mouth daily.    [provider]  aspirin 81 MG EC tablet Take 81 mg by mouth daily.    [provider]  atenolol (TENORMIN) 25 MG tablet Take 25 mg by mouth daily. 08/14/20   [provider]  buPROPion (WELLBUTRIN XL) 300 MG 24 hr tablet Take 300 mg by mouth daily. 05/07/23   [provider]  carisoprodol (SOMA) 350 MG tablet Take 350 mg by mouth 3 (three) times daily. 08/04/20   [provider]  cefdinir (OMNICEF) 300 MG capsule Take 300 mg by mouth every 12 (twelve) hours. 09/20/23   [provider]  colchicine 0.6 MG tablet Take 0.6 mg by mouth daily. 09/04/20   [provider]  cyclobenzaprine (FLEXERIL) 10 MG tablet Take 10 mg by mouth 3 (three) times daily. 05/07/23   [provider]  docusate sodium (COLACE) 100 MG capsule Take 100 mg by mouth 2 (two) times daily.    [provider]  fenofibrate (TRICOR) 48 MG tablet Take 48 mg by mouth daily. 08/14/20   [provider]  FEROSUL 325 (65 Fe) MG tablet Take 325 mg by mouth daily. 08/15/20   [provider]  folic acid (FOLVITE) 1 MG tablet Take 1 mg by mouth daily. 08/14/20   [provider]  furosemide (LASIX) 20 MG tablet Take 20 mg by mouth daily. 09/08/23   [provider]  furosemide (LASIX) 40 MG tablet Take 1 tablet (40 mg total) by mouth daily. 09/07/23   Johnson, [name], MD  gabapentin (NEURONTIN) 600 MG tablet Take 600 mg by mouth 3 (three) times daily. 04/15/23   [provider]  glipiZIDE (GLUCOTROL XL) 2.5 MG 24 hr tablet Take 2.5 mg by mouth every morning. 08/14/20   [provider]  HUMULIN R 100 UNIT/ML injection Inject 30 Units into the skin 3 (three) times daily before meals. 05/08/23   [provider]  hydroxychloroquine (PLAQUENIL) 200 MG tablet Take 400 mg by mouth daily. 08/14/20   [provider]  ibuprofen (ADVIL) 800 MG tablet Take 800 mg by mouth 2 (two) times daily as needed. 05/07/23   [provider]  indomethacin (INDOCIN) 50 MG capsule Take 50 mg by mouth 3 (three) times daily. 05/07/23   [provider]  ipratropium-albuterol (DUONEB) 0.5-2.5 (3) MG/3ML SOLN Take 3 mLs by nebulization 3 (three) times daily. 08/14/20   [provider]  LANTUS SOLOSTAR 100 UNIT/ML Solostar Pen Inject 68 Units into the skin 2 (two) times daily. 09/04/20   [provider]   losartan-hydrochlorothiazide (HYZAAR) 100-12.5 MG tablet Take 1 tablet by mouth daily. 08/14/20   [provider]  methotrexate (RHEUMATREX) 2.5 MG tablet Take 12.5 mg by mouth once a week. 09/08/23   [provider]  montelukast (SINGULAIR) 10 MG tablet Take 10 mg by mouth daily. 05/07/23   [provider]  Multiple Vitamins-Calcium (ONE-A-DAY WOMENS FORMULA) TABS Take 1 tablet by mouth daily.    [provider]  nystatin (MYCOSTATIN/NYSTOP) powder Apply topically 2 (two) times daily. 02/15/23   [provider]  nystatin cream (MYCOSTATIN) Apply topically 2 (two) times daily. 05/07/23   [provider]  nystatin ointment (MYCOSTATIN) Apply 1 application topically 2 (two) times daily.  [provider]  omeprazole (PRILOSEC) 40 MG capsule Take 40 mg by mouth daily. 08/15/20   [provider]  ondansetron (ZOFRAN-ODT) 4 MG disintegrating tablet Take 4 mg by mouth 3 (three) times daily as needed. 06/02/23   [provider]  Oxycodone HCl 10 MG TABS Take 10 mg by mouth 4 (four) times daily. 08/25/20   [provider]  predniSONE (DELTASONE) 10 MG tablet Take by mouth. 09/20/23   [provider]  predniSONE (DELTASONE) 20 MG tablet Take 2 tablets (40 mg total) by mouth daily with breakfast. Take 2 PO QAM x5 days 09/07/23   [name], [name], MD  PREMARIN 0.625 MG tablet Take 0.625 mg by mouth daily. 08/14/20   [provider]  promethazine (PHENERGAN) 25 MG tablet Take 25 mg by mouth every 12 (twelve) hours.    [provider]  promethazine-dextromethorphan (PROMETHAZINE-DM) 6.25-15 MG/5ML syrup Take 5 mLs by mouth 3 (three) times daily as needed. 06/02/23   [provider]  sertraline (ZOLOFT) 100 MG tablet Take 100 mg by mouth daily. 09/08/23   [provider]  sertraline (ZOLOFT) 50 MG tablet Take 50 mg by mouth daily. 05/08/23   [provider]  simvastatin (ZOCOR) 40  MG tablet Take 40 mg by mouth every evening. 08/14/20   [provider]  sitaGLIPtin (JANUVIA) 100 MG tablet Take 100 mg by mouth daily.    [provider]  Vitamin D, Ergocalciferol, (DRISDOL) 1.25 MG (50000 UNIT) CAPS capsule Take 50,000 Units by mouth once a week. 08/14/20   [provider]    Physical Exam: Vitals:   09/26/23 1236 09/26/23 1242 09/26/23 1314 09/26/23 1320  BP: (!) 153/89   (!) 159/94  Pulse: 75   80  Resp: 19   15  Temp:  98.2 F (36.8 C) 98.2 F (36.8 C)   TempSrc:  Oral Oral   SpO2: 97%   99%  Weight:   (!) 158.8 kg   Height:   5' 6.5" (1.689 m)     Constitutional: NAD, calm, comfortable, morbidly obese Vitals:   09/26/23 1236 09/26/23 1242 09/26/23 1314 09/26/23 1320  BP: (!) 153/89   (!) 159/94  Pulse: 75   80  Resp: 19   15  Temp:  98.2 F (36.8 C) 98.2 F (36.8 C)   TempSrc:  Oral Oral   SpO2: 97%   99%  Weight:   (!) 158.8 kg   Height:   5' 6.5" (1.689 m)    Eyes: lids and conjunctivae normal Neck: normal, supple Respiratory: clear to auscultation bilaterally. Normal respiratory effort. No accessory muscle use.  6 L nasal cannula Cardiovascular: Regular rate and rhythm, no murmurs. Abdomen: no tenderness, no distention. Bowel sounds positive.  Musculoskeletal:  No edema. Skin: no rashes, lesions, ulcers.  Psychiatric: Flat affect  Labs on Admission: I have personally reviewed following labs and imaging studies  CBC: Recent Labs  Lab 09/26/23 1137  WBC 8.1  NEUTROABS 5.6  HGB 9.1*  HCT 29.7*  MCV 101.0*  PLT 245   Basic Metabolic Panel: Recent Labs  Lab 09/26/23 1137  NA 141  K 4.3  CL 91*  CO2 39*  GLUCOSE 153*  BUN 25*  CREATININE 0.96  CALCIUM 9.3   GFR: Estimated Creatinine Clearance: 103.1 mL/min (by C-G formula based on SCr of 0.96 mg/dL). Liver Function Tests: Recent Labs  Lab 09/26/23 1137  AST 30  ALT 43  ALKPHOS 63  BILITOT 0.4  PROT 6.8  ALBUMIN 3.1*   No results for  input(s): "LIPASE", "AMYLASE" in the last 168 hours. No results for input(s): "AMMONIA" in the last 168 hours. Coagulation Profile: Recent Labs  Lab 09/26/23 1137  INR 0.9   Cardiac Enzymes: No results for input(s): "CKTOTAL", "CKMB", "CKMBINDEX", "TROPONINI" in the last 168 hours. BNP (last 3 results) No results for input(s): "PROBNP" in the last 8760 hours. HbA1C: No results for input(s): "HGBA1C" in the last 72 hours. CBG: No results for input(s): "GLUCAP" in the last 168 hours. Lipid Profile: No results for input(s): "CHOL", "HDL", "LDLCALC", "TRIG", "CHOLHDL", "LDLDIRECT" in the last 72 hours. Thyroid Function Tests: No results for input(s): "TSH", "T4TOTAL", "FREET4", "T3FREE", "THYROIDAB" in the last 72 hours. Anemia Panel: No results for input(s): "VITAMINB12", "FOLATE", "FERRITIN", "TIBC", "IRON", "RETICCTPCT" in the last 72 hours. Urine analysis:    Component Value Date/Time   COLORURINE YELLOW 06/03/2023 2335   APPEARANCEUR HAZY (A) 06/03/2023 2335   LABSPEC 1.016 06/03/2023 2335   PHURINE 7.0 06/03/2023 2335   GLUCOSEU NEGATIVE 06/03/2023 2335   HGBUR NEGATIVE 06/03/2023 2335   BILIRUBINUR NEGATIVE 06/03/2023 2335   KETONESUR NEGATIVE 06/03/2023 2335   PROTEINUR 30 (A) 06/03/2023 2335   NITRITE NEGATIVE 06/03/2023 2335   LEUKOCYTESUR NEGATIVE 06/03/2023 2335    Radiological Exams on Admission: DG Chest 2 View Result Date: 09/26/2023 CLINICAL DATA:  Shortness of breath.  Hypoxia. EXAM: CHEST - 2 VIEW COMPARISON:  09/07/2023 FINDINGS: Improved aeration of both lungs is seen. Mild atelectasis or scarring again seen at right lung base. No No evidence of focal consolidation or pleural effusion. IMPRESSION: Improved aeration of both lungs. Mild right basilar atelectasis versus scarring. Electronically Signed   By: Danae Orleans M.D.   On: 09/26/2023 11:30    EKG: Independently reviewed.  SR 78 bpm.  Assessment/Plan Principal Problem:   Acute on chronic hypoxic  respiratory failure (HCC) Active Problems:   CREST syndrome (HCC)   DM2 (diabetes mellitus, type 2) (HCC)   HTN (hypertension)    Acute on chronic hypoxemic respiratory failure secondary to COVID-19 pneumonia -Appears to be improved and nearing baseline -Started on remdesivir which will be continued for now -Continue isolation precautions -Given 1 dose of IV steroids, discontinue rest  History of COPD -Chronically wears 6 L nasal cannula at home -No acute bronchospasms currently noted -Albuterol ordered as needed  Hypertension -Continue home medications  Type 2 diabetes with mild hyperglycemia -Carb modified diet and SSI  CREST syndrome -Temporarily hold home Plaquenil  Morbid obesity -BMI 53.22   DVT prophylaxis: Lovenox Code Status: Full Family Communication: None at bedside Disposition Plan:Tx for COVID Consults called:None Admission status: Inpatient, SDU  Severity of Illness: The appropriate patient status for this patient is INPATIENT. Inpatient status is judged to be reasonable and necessary in order to provide the required intensity of service to ensure the patient's safety. The patient's presenting symptoms, physical exam findings, and initial radiographic and laboratory data in the context of their chronic comorbidities is felt to place them at high risk for further clinical deterioration. Furthermore, it is not anticipated that the patient will be medically stable for discharge from the hospital within 2 midnights of admission.   * I certify that at the point of admission it is my clinical judgment that the patient will require inpatient hospital care spanning beyond 2 midnights from the point of admission due to high intensity of service, high risk for further deterioration and high frequency of surveillance required.*   Jazsmin Couse D  Sherryll Burger DO [facility]  If 7PM-7AM, please contact night-coverage [email or web address]  09/26/2023, 1:47 PM

## 2023-09-26 NOTE — ED Notes (Signed)
EDP at bedside during triage

## 2023-09-27 DIAGNOSIS — J9621 Acute and chronic respiratory failure with hypoxia: Secondary | ICD-10-CM | POA: Diagnosis not present

## 2023-09-27 LAB — CBC
HCT: 31.4 % — ABNORMAL LOW (ref 36.0–46.0)
Hemoglobin: 9.5 g/dL — ABNORMAL LOW (ref 12.0–15.0)
MCH: 30.8 pg (ref 26.0–34.0)
MCHC: 30.3 g/dL (ref 30.0–36.0)
MCV: 101.9 fL — ABNORMAL HIGH (ref 80.0–100.0)
Platelets: 241 10*3/uL (ref 150–400)
RBC: 3.08 MIL/uL — ABNORMAL LOW (ref 3.87–5.11)
RDW: 15 % (ref 11.5–15.5)
WBC: 9.8 10*3/uL (ref 4.0–10.5)
nRBC: 0.7 % — ABNORMAL HIGH (ref 0.0–0.2)

## 2023-09-27 LAB — BASIC METABOLIC PANEL
Anion gap: 9 (ref 5–15)
BUN: 27 mg/dL — ABNORMAL HIGH (ref 6–20)
CO2: 36 mmol/L — ABNORMAL HIGH (ref 22–32)
Calcium: 8.8 mg/dL — ABNORMAL LOW (ref 8.9–10.3)
Chloride: 92 mmol/L — ABNORMAL LOW (ref 98–111)
Creatinine, Ser: 0.84 mg/dL (ref 0.44–1.00)
GFR, Estimated: >60 mL/min (ref >60)
Glucose, Bld: 264 mg/dL — ABNORMAL HIGH (ref 70–99)
Potassium: 4.6 mmol/L (ref 3.5–5.1)
Sodium: 137 mmol/L (ref 135–145)

## 2023-09-27 LAB — GLUCOSE, CAPILLARY
Glucose-Capillary: 228 mg/dL — ABNORMAL HIGH (ref 70–99)
Glucose-Capillary: 226 mg/dL — ABNORMAL HIGH (ref 70–99)

## 2023-09-27 LAB — MAGNESIUM: Magnesium: 2.1 mg/dL (ref 1.7–2.4)

## 2023-09-27 MED ORDER — LABETALOL HCL 5 MG/ML IV SOLN: 5.0000 mg | Freq: Four times a day (QID) | INTRAVENOUS | Status: DC | PRN

## 2023-09-27 MED ORDER — LABETALOL HCL 5 MG/ML IV SOLN
5.0000 mg | Freq: Once | INTRAVENOUS | Status: AC
  Administered 2023-09-27: 5 mg via INTRAVENOUS
  Filled 2023-09-27: qty 4

## 2023-09-27 NOTE — Discharge Summary (Signed)
Physician Discharge Summary  Jennifer Odonnell OZH:[id number] DOB: 14-May-1966 DOA: 09/26/2023  PCP: Aggie Cosier, MD  Admit date: 09/26/2023  Discharge date: 09/27/2023  Admitted From:Home  Disposition:  Home  Recommendations for Outpatient Follow-up:  Follow up with PCP in 1-2 weeks Continue home medications as prior  Home Health: None  Equipment/Devices: Has home 6 L nasal cannula at baseline  Discharge Condition:Stable  CODE STATUS: Full  Diet recommendation: Heart Healthy/carb modified  Brief/Interim Summary:  Jennifer Odonnell is a 58 y.o. female with medical history significant for COPD with chronic hypoxemia on 6 L nasal cannula at home, obesity hypoventilation syndrome, type 2 diabetes, hypertension, CREST syndrome, and morbid obesity who presented to the ED with worsening shortness of breath.  She was recently seen in the ED 1/5 and was thought to have some pulmonary edema as well as multifocal pneumonia on chest x-ray.  She was given some IV Lasix and sent home with some prednisone as well as doxycycline for 5 days.  She states that despite taking these medications, she has not improved.  She was admitted for acute on chronic hypoxemic respiratory failure secondary to COVID-19 pneumonia and has improved faster than expected.  She received 1 dose of IV steroids and was able to come down to her baseline oxygen requirements at 6 L.  She has received remdesivir and is now in stable condition for discharge.  No other acute events or concerns noted during this brief admission.  Discharge Diagnoses:  Principal Problem:   Acute on chronic hypoxic respiratory failure (HCC) Active Problems:   CREST syndrome (HCC)   DM2 (diabetes mellitus, type 2) (HCC)   HTN (hypertension)  Principal discharge diagnosis: Acute on chronic hypoxemic respiratory failure secondary to COVID-19 pneumonia.  Discharge Instructions  Discharge Instructions     Diet - low sodium heart  healthy   Complete by: As directed    Increase activity slowly   Complete by: As directed       Allergies as of 09/27/2023       Reactions   Daucus Carota Swelling   Raw carrots   Lisinopril Anaphylaxis   Tiotropium Swelling   Erythromycin Diarrhea   Ambien [zolpidem] Other (See Comments)   Woke up disoriented and scared.   Spiriva Handihaler [tiotropium Bromide Monohydrate]         Medication List     TAKE these medications    Advair Diskus 250-50 MCG/ACT Aepb Generic drug: fluticasone-salmeterol 1 puff 2 (two) times daily.   albuterol 108 (90 Base) MCG/ACT inhaler Commonly known as: VENTOLIN HFA Inhale 2 puffs into the lungs every 6 (six) hours as needed for wheezing or shortness of breath.   albuterol (2.5 MG/3ML) 0.083% nebulizer solution Commonly known as: PROVENTIL Take 3 mLs (2.5 mg total) by nebulization every 4 (four) hours as needed for wheezing or shortness of breath.   ascorbic acid 500 MG tablet Commonly known as: VITAMIN C Take 500 mg by mouth daily.   aspirin EC 81 MG tablet Take 81 mg by mouth daily.   atenolol 25 MG tablet Commonly known as: TENORMIN Take 25 mg by mouth daily.   buPROPion 300 MG 24 hr tablet Commonly known as: WELLBUTRIN XL Take 300 mg by mouth daily.   carisoprodol 350 MG tablet Commonly known as: SOMA Take 350 mg by mouth 3 (three) times daily.   cefdinir 300 MG capsule Commonly known as: OMNICEF Take 300 mg by mouth every 12 (twelve) hours.   colchicine 0.6 MG  tablet Take 0.6 mg by mouth daily.   cyclobenzaprine 10 MG tablet Commonly known as: FLEXERIL Take 10 mg by mouth 3 (three) times daily.   docusate sodium 100 MG capsule Commonly known as: COLACE Take 100 mg by mouth 2 (two) times daily.   fenofibrate 48 MG tablet Commonly known as: TRICOR Take 48 mg by mouth daily.   FeroSul 325 (65 Fe) MG tablet Generic drug: ferrous sulfate Take 325 mg by mouth daily.   folic acid 1 MG tablet Commonly known  as: FOLVITE Take 1 mg by mouth daily.   furosemide 40 MG tablet Commonly known as: LASIX Take 1 tablet (40 mg total) by mouth daily.   furosemide 20 MG tablet Commonly known as: LASIX Take 20 mg by mouth daily.   gabapentin 600 MG tablet Commonly known as: NEURONTIN Take 600 mg by mouth 3 (three) times daily.   glipiZIDE 2.5 MG 24 hr tablet Commonly known as: GLUCOTROL XL Take 2.5 mg by mouth every morning.   HumuLIN R 100 UNIT/ML injection Generic drug: insulin regular Inject 30 Units into the skin 3 (three) times daily before meals.   hydroxychloroquine 200 MG tablet Commonly known as: PLAQUENIL Take 400 mg by mouth daily.   ibuprofen 800 MG tablet Commonly known as: ADVIL Take 800 mg by mouth 2 (two) times daily as needed.   indomethacin 50 MG capsule Commonly known as: INDOCIN Take 50 mg by mouth 3 (three) times daily.   ipratropium-albuterol 0.5-2.5 (3) MG/3ML Soln Commonly known as: DUONEB Take 3 mLs by nebulization 3 (three) times daily.   Lantus SoloStar 100 UNIT/ML Solostar Pen Generic drug: insulin glargine Inject 68 Units into the skin 2 (two) times daily.   losartan-hydrochlorothiazide 100-12.5 MG tablet Commonly known as: HYZAAR Take 1 tablet by mouth daily.   methotrexate 2.5 MG tablet Commonly known as: RHEUMATREX Take 12.5 mg by mouth once a week.   montelukast 10 MG tablet Commonly known as: SINGULAIR Take 10 mg by mouth daily.   nystatin ointment Commonly known as: MYCOSTATIN Apply 1 application topically 2 (two) times daily.   nystatin powder Commonly known as: MYCOSTATIN/NYSTOP Apply topically 2 (two) times daily.   nystatin cream Commonly known as: MYCOSTATIN Apply topically 2 (two) times daily.   omeprazole 40 MG capsule Commonly known as: PRILOSEC Take 40 mg by mouth daily.   ondansetron 4 MG disintegrating tablet Commonly known as: ZOFRAN-ODT Take 4 mg by mouth 3 (three) times daily as needed.   One-A-Day Womens  Formula Tabs Take 1 tablet by mouth daily.   Oxycodone HCl 10 MG Tabs Take 10 mg by mouth 4 (four) times daily.   predniSONE 20 MG tablet Commonly known as: DELTASONE Take 2 tablets (40 mg total) by mouth daily with breakfast. Take 2 PO QAM x5 days   predniSONE 10 MG tablet Commonly known as: DELTASONE Take by mouth.   Premarin 0.625 MG tablet Generic drug: estrogens (conjugated) Take 0.625 mg by mouth daily.   promethazine 25 MG tablet Commonly known as: PHENERGAN Take 25 mg by mouth every 12 (twelve) hours.   promethazine-dextromethorphan 6.25-15 MG/5ML syrup Commonly known as: PROMETHAZINE-DM Take 5 mLs by mouth 3 (three) times daily as needed.   sertraline 50 MG tablet Commonly known as: ZOLOFT Take 50 mg by mouth daily.   sertraline 100 MG tablet Commonly known as: ZOLOFT Take 100 mg by mouth daily.   simvastatin 40 MG tablet Commonly known as: ZOCOR Take 40 mg by mouth every evening.  sitaGLIPtin 100 MG tablet Commonly known as: JANUVIA Take 100 mg by mouth daily.   Vitamin D (Ergocalciferol) 1.25 MG (50000 UNIT) Caps capsule Commonly known as: DRISDOL Take 50,000 Units by mouth once a week.        Follow-up Information     Aggie Cosier, MD. Schedule an appointment as soon as possible for a visit in 1 week(s).   Specialties: Internal Medicine, Infectious Diseases Contact information: [facility] [address] [place] [address] [phone]                Allergies  Allergen Reactions   Daucus Carota Swelling    Raw carrots   Lisinopril Anaphylaxis   Tiotropium Swelling   Erythromycin Diarrhea   Ambien [Zolpidem] Other (See Comments)    Woke up disoriented and scared.   Spiriva Handihaler [Tiotropium Bromide Monohydrate]     Consultations: None   Procedures/Studies: DG Chest 2 View Result Date: 09/26/2023 CLINICAL DATA:  Shortness of breath.  Hypoxia. EXAM: CHEST - 2 VIEW COMPARISON:  09/07/2023 FINDINGS:  Improved aeration of both lungs is seen. Mild atelectasis or scarring again seen at right lung base. No No evidence of focal consolidation or pleural effusion. IMPRESSION: Improved aeration of both lungs. Mild right basilar atelectasis versus scarring. Electronically Signed   By: Danae Orleans M.D.   On: 09/26/2023 11:30   DG Knee 2 Views Right Result Date: 09/07/2023 CLINICAL DATA:  Knee pain after a fall. EXAM: RIGHT KNEE - 1-2 VIEW COMPARISON:  Knee radiographs dated 06/03/2023. FINDINGS: No evidence of fracture, dislocation, or joint effusion. 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 Fixation screws are seen in the tibia. There is soft tissue swelling around the knee. IMPRESSION: No acute fracture or dislocation. Electronically Signed   By: Romona Curls M.D.   On: 09/07/2023 12:32   DG Knee 2 Views Left Result Date: 09/07/2023 CLINICAL DATA:  Knee pain after a fall. EXAM: LEFT KNEE - 1-2 VIEW COMPARISON:  Knee radiographs dated 06/03/2023 FINDINGS: No evidence of fracture, dislocation, or joint effusion. There is moderate to severe tricompartmental osteoarthritis. Fixation screws are seen in the tibia. Soft tissue swelling around the knee. IMPRESSION: No acute fracture or dislocation. Electronically Signed   By: Romona Curls M.D.   On: 09/07/2023 12:30   DG Chest 2 View Result Date: 09/07/2023 CLINICAL DATA:  Fall with congestion, dyspnea. EXAM: CHEST - 2 VIEW COMPARISON:  Chest radiograph dated 06/03/2023. FINDINGS: The heart is enlarged. There is mild-to-moderate patchy bilateral interstitial and airspace opacities. Bibasilar atelectasis likely contributes. No pleural effusion or pneumothorax. Degenerative changes are seen in the spine. IMPRESSION: Mild-to-moderate patchy bilateral interstitial and airspace opacities may reflect pulmonary edema or multifocal pneumonia. Electronically Signed   By: Romona Curls M.D.   On: 09/07/2023 12:29     Discharge Exam: Vitals:   09/27/23 0721  09/27/23 0824  BP:    Pulse:    Resp:    Temp: 98.3 F (36.8 C)   SpO2:  100%   Vitals:   09/27/23 0615 09/27/23 0700 09/27/23 0721 09/27/23 0824  BP: (!) 188/82 (!) 180/91    Pulse: 76 65    Resp: 18 12    Temp:   98.3 F (36.8 C)   TempSrc:   Oral   SpO2: 97% 100%  100%  Weight:      Height:        General: Pt is alert, awake, not in acute distress, morbidly obese Cardiovascular: RRR, S1/S2 +, no  rubs, no gallops Respiratory: CTA bilaterally, no wheezing, no rhonchi, 6 L nasal cannula Abdominal: Soft, NT, ND, bowel sounds + Extremities: no edema, no cyanosis    The results of significant diagnostics from this hospitalization (including imaging, microbiology, ancillary and laboratory) are listed below for reference.     Microbiology: Recent Results (from the past 240 hours)  Resp panel by RT-PCR (RSV, Flu A&B, Covid) Anterior Nasal Swab     Status: Abnormal   Collection Time: 09/26/23 11:01 AM   Specimen: Anterior Nasal Swab  Result Value Ref Range Status   SARS Coronavirus 2 by RT PCR POSITIVE (A) NEGATIVE Final    Comment: (NOTE) SARS-CoV-2 target nucleic acids are DETECTED.  The SARS-CoV-2 RNA is generally detectable in upper respiratory specimens during the acute phase of infection. Positive results are indicative of the presence of the identified virus, but do not rule out bacterial infection or co-infection with other pathogens not detected by the test. Clinical correlation with patient history and other diagnostic information is necessary to determine patient infection status. The expected result is Negative.  Fact Sheet for Patients: BloggerCourse.com  Fact Sheet for Healthcare Providers: SeriousBroker.it  This test is not yet approved or cleared by the [place] FDA and  has been authorized for detection and/or diagnosis of SARS-CoV-2 by FDA under an Emergency Use Authorization (EUA).  This EUA  will remain in effect (meaning this test can be used) for the duration of  the COVID-19 declaration under Section 564(b)(1) of the A ct, 21 U.S.C. section 360bbb-3(b)(1), unless the authorization is terminated or revoked sooner.     Influenza A by PCR NEGATIVE NEGATIVE Final   Influenza B by PCR NEGATIVE NEGATIVE Final    Comment: (NOTE) The Xpert Xpress SARS-CoV-2/FLU/RSV plus assay is intended as an aid in the diagnosis of influenza from Nasopharyngeal swab specimens and should not be used as a sole basis for treatment. Nasal washings and aspirates are unacceptable for Xpert Xpress SARS-CoV-2/FLU/RSV testing.  Fact Sheet for Patients: BloggerCourse.com  Fact Sheet for Healthcare Providers: SeriousBroker.it  This test is not yet approved or cleared by the [place] FDA and has been authorized for detection and/or diagnosis of SARS-CoV-2 by FDA under an Emergency Use Authorization (EUA). This EUA will remain in effect (meaning this test can be used) for the duration of the COVID-19 declaration under Section 564(b)(1) of the Act, 21 U.S.C. section 360bbb-3(b)(1), unless the authorization is terminated or revoked.     Resp Syncytial Virus by PCR NEGATIVE NEGATIVE Final    Comment: (NOTE) Fact Sheet for Patients: BloggerCourse.com  Fact Sheet for Healthcare Providers: SeriousBroker.it  This test is not yet approved or cleared by the [place] FDA and has been authorized for detection and/or diagnosis of SARS-CoV-2 by FDA under an Emergency Use Authorization (EUA). This EUA will remain in effect (meaning this test can be used) for the duration of the COVID-19 declaration under Section 564(b)(1) of the Act, 21 U.S.C. section 360bbb-3(b)(1), unless the authorization is terminated or revoked.  Performed at [facility], [address], [place], [place] [address]   Blood  Culture (routine x 2)     Status: None (Preliminary result)   Collection Time: 09/26/23 11:37 AM   Specimen: BLOOD  Result Value Ref Range Status   Specimen Description BLOOD BLOOD RIGHT ARM  Final   Special Requests   Final    BOTTLES DRAWN AEROBIC AND ANAEROBIC Blood Culture adequate volume   Culture   Final  NO GROWTH < 24 HOURS Performed at [facility], [address], [place], [place] [address]    Report Status PENDING  Incomplete  Blood Culture (routine x 2)     Status: None (Preliminary result)   Collection Time: 09/26/23 11:37 AM   Specimen: BLOOD  Result Value Ref Range Status   Specimen Description BLOOD BLOOD LEFT HAND  Final   Special Requests   Final    BOTTLES DRAWN AEROBIC AND ANAEROBIC Blood Culture adequate volume   Culture   Final    NO GROWTH < 24 HOURS Performed at [facility], [address] [place], [place] [address]    Report Status PENDING  Incomplete  MRSA Next Gen by PCR, Nasal     Status: Abnormal   Collection Time: 09/26/23  1:19 PM   Specimen: Nasal Mucosa; Nasal Swab  Result Value Ref Range Status   MRSA by PCR Next Gen DETECTED (A) NOT DETECTED Final    Comment: RESULT CALLED TO, READ BACK BY AND VERIFIED WITH: GEORGE,RN ON 09/26/23 AT 1903 BY PURDIE,J        The GeneXpert MRSA Assay (FDA approved for NASAL specimens only), is one component of a comprehensive MRSA colonization surveillance program. It is not intended to diagnose MRSA infection nor to guide or monitor treatment for MRSA infections. Performed at [facility], [address], [place], [place] [address]      Labs: BNP (last 3 results) Recent Labs    06/03/23 2234 09/07/23 1021  BNP 30.0 93.0   Basic Metabolic Panel: Recent Labs  Lab 09/26/23 1137 09/27/23 0604  NA 141 137  K 4.3 4.6  CL 91* 92*  CO2 39* 36*  GLUCOSE 153* 264*  BUN 25* 27*  CREATININE 0.96 0.84  CALCIUM 9.3 8.8*  MG  --  2.1   Liver Function Tests: Recent Labs  Lab 09/26/23 1137  AST  30  ALT 43  ALKPHOS 63  BILITOT 0.4  PROT 6.8  ALBUMIN 3.1*   No results for input(s): "LIPASE", "AMYLASE" in the last 168 hours. No results for input(s): "AMMONIA" in the last 168 hours. CBC: Recent Labs  Lab 09/26/23 1137 09/27/23 0604  WBC 8.1 9.8  NEUTROABS 5.6  --   HGB 9.1* 9.5*  HCT 29.7* 31.4*  MCV 101.0* 101.9*  PLT 245 241   Cardiac Enzymes: No results for input(s): "CKTOTAL", "CKMB", "CKMBINDEX", "TROPONINI" in the last 168 hours. BNP: Invalid input(s): "POCBNP" CBG: Recent Labs  Lab 09/26/23 1610 09/26/23 2259 09/27/23 0726  GLUCAP 146* 443* 228*   D-Dimer No results for input(s): "DDIMER" in the last 72 hours. Hgb A1c No results for input(s): "HGBA1C" in the last 72 hours. Lipid Profile No results for input(s): "CHOL", "HDL", "LDLCALC", "TRIG", "CHOLHDL", "LDLDIRECT" in the last 72 hours. Thyroid function studies No results for input(s): "TSH", "T4TOTAL", "T3FREE", "THYROIDAB" in the last 72 hours.  Invalid input(s): "FREET3" Anemia work up No results for input(s): "VITAMINB12", "FOLATE", "FERRITIN", "TIBC", "IRON", "RETICCTPCT" in the last 72 hours. Urinalysis    Component Value Date/Time   COLORURINE YELLOW 09/26/2023 1057   APPEARANCEUR HAZY (A) 09/26/2023 1057   LABSPEC 1.035 (H) 09/26/2023 1057   PHURINE 6.0 09/26/2023 1057   GLUCOSEU 150 (A) 09/26/2023 1057   HGBUR NEGATIVE 09/26/2023 1057   BILIRUBINUR NEGATIVE 09/26/2023 1057   KETONESUR 5 (A) 09/26/2023 1057   PROTEINUR 100 (A) 09/26/2023 1057   NITRITE NEGATIVE 09/26/2023 1057   LEUKOCYTESUR NEGATIVE 09/26/2023 1057   Sepsis Labs Recent  Labs  Lab 09/26/23 1137 09/27/23 0604  WBC 8.1 9.8   Microbiology Recent Results (from the past 240 hours)  Resp panel by RT-PCR (RSV, Flu A&B, Covid) Anterior Nasal Swab     Status: Abnormal   Collection Time: 09/26/23 11:01 AM   Specimen: Anterior Nasal Swab  Result Value Ref Range Status   SARS Coronavirus 2 by RT PCR POSITIVE (A)  NEGATIVE Final    Comment: (NOTE) SARS-CoV-2 target nucleic acids are DETECTED.  The SARS-CoV-2 RNA is generally detectable in upper respiratory specimens during the acute phase of infection. Positive results are indicative of the presence of the identified virus, but do not rule out bacterial infection or co-infection with other pathogens not detected by the test. Clinical correlation with patient history and other diagnostic information is necessary to determine patient infection status. The expected result is Negative.  Fact Sheet for Patients: BloggerCourse.com  Fact Sheet for Healthcare Providers: SeriousBroker.it  This test is not yet approved or cleared by the [place] FDA and  has been authorized for detection and/or diagnosis of SARS-CoV-2 by FDA under an Emergency Use Authorization (EUA).  This EUA will remain in effect (meaning this test can be used) for the duration of  the COVID-19 declaration under Section 564(b)(1) of the A ct, 21 U.S.C. section 360bbb-3(b)(1), unless the authorization is terminated or revoked sooner.     Influenza A by PCR NEGATIVE NEGATIVE Final   Influenza B by PCR NEGATIVE NEGATIVE Final    Comment: (NOTE) The Xpert Xpress SARS-CoV-2/FLU/RSV plus assay is intended as an aid in the diagnosis of influenza from Nasopharyngeal swab specimens and should not be used as a sole basis for treatment. Nasal washings and aspirates are unacceptable for Xpert Xpress SARS-CoV-2/FLU/RSV testing.  Fact Sheet for Patients: BloggerCourse.com  Fact Sheet for Healthcare Providers: SeriousBroker.it  This test is not yet approved or cleared by the [place] FDA and has been authorized for detection and/or diagnosis of SARS-CoV-2 by FDA under an Emergency Use Authorization (EUA). This EUA will remain in effect (meaning this test can be used) for the  duration of the COVID-19 declaration under Section 564(b)(1) of the Act, 21 U.S.C. section 360bbb-3(b)(1), unless the authorization is terminated or revoked.     Resp Syncytial Virus by PCR NEGATIVE NEGATIVE Final    Comment: (NOTE) Fact Sheet for Patients: BloggerCourse.com  Fact Sheet for Healthcare Providers: SeriousBroker.it  This test is not yet approved or cleared by the [place] FDA and has been authorized for detection and/or diagnosis of SARS-CoV-2 by FDA under an Emergency Use Authorization (EUA). This EUA will remain in effect (meaning this test can be used) for the duration of the COVID-19 declaration under Section 564(b)(1) of the Act, 21 U.S.C. section 360bbb-3(b)(1), unless the authorization is terminated or revoked.  Performed at [facility], [address] [place], [place] [address]   Blood Culture (routine x 2)     Status: None (Preliminary result)   Collection Time: 09/26/23 11:37 AM   Specimen: BLOOD  Result Value Ref Range Status   Specimen Description BLOOD BLOOD RIGHT ARM  Final   Special Requests   Final    BOTTLES DRAWN AEROBIC AND ANAEROBIC Blood Culture adequate volume   Culture   Final    NO GROWTH < 24 HOURS Performed at [facility], [address] [place], [place] [address]    Report Status PENDING  Incomplete  Blood Culture (routine x 2)     Status: None (Preliminary result)  Collection Time: 09/26/23 11:37 AM   Specimen: BLOOD  Result Value Ref Range Status   Specimen Description BLOOD BLOOD LEFT HAND  Final   Special Requests   Final    BOTTLES DRAWN AEROBIC AND ANAEROBIC Blood Culture adequate volume   Culture   Final    NO GROWTH < 24 HOURS Performed at [facility], [address], [place], [place] [address]    Report Status PENDING  Incomplete  MRSA Next Gen by PCR, Nasal     Status: Abnormal   Collection Time: 09/26/23  1:19 PM   Specimen: Nasal Mucosa; Nasal Swab  Result  Value Ref Range Status   MRSA by PCR Next Gen DETECTED (A) NOT DETECTED Final    Comment: RESULT CALLED TO, READ BACK BY AND VERIFIED WITH: GEORGE,RN ON 09/26/23 AT 1903 BY PURDIE,J        The GeneXpert MRSA Assay (FDA approved for NASAL specimens only), is one component of a comprehensive MRSA colonization surveillance program. It is not intended to diagnose MRSA infection nor to guide or monitor treatment for MRSA infections. Performed at [facility], [address], [place], [place] [address]      Time coordinating discharge: 35 minutes  SIGNED:   Erick Blinks, DO [facility] 09/27/2023, 9:31 AM  If 7PM-7AM, please contact night-coverage [email or web address]

## 2023-09-27 NOTE — Plan of Care (Signed)
  Problem: Education: Goal: Knowledge of risk factors and measures for prevention of condition will improve Outcome: Progressing   Problem: Coping: Goal: Psychosocial and spiritual needs will be supported Outcome: Progressing   Problem: Clinical Measurements: Goal: Respiratory complications will improve Outcome: Progressing   Problem: Nutrition: Goal: Adequate nutrition will be maintained Outcome: Progressing   Problem: Coping: Goal: Level of anxiety will decrease Outcome: Progressing   Problem: Safety: Goal: Ability to remain free from injury will improve Outcome: Progressing

## 2023-09-27 NOTE — Plan of Care (Signed)
Problem: Education: Goal: Knowledge of risk factors and measures for prevention of condition will improve Outcome: Progressing   Problem: Coping: Goal: Psychosocial and spiritual needs will be supported Outcome: Progressing   Problem: Respiratory: Goal: Complications related to the disease process, condition or treatment will be avoided or minimized Outcome: Progressing   Problem: Education: Goal: Knowledge of General Education information will improve Description: Including pain rating scale, medication(s)/side effects and non-pharmacologic comfort measures Outcome: Progressing   Problem: Health Behavior/Discharge Planning: Goal: Ability to manage health-related needs will improve Outcome: Progressing

## 2023-09-27 NOTE — Progress Notes (Signed)
Patient has discharge orders, discharge teaching given and no further questions at this time. Patient wheeled down to main lobby by staff to main entrance.

## 2023-10-01 LAB — CULTURE, BLOOD (ROUTINE X 2)
Special Requests: ADEQUATE
Special Requests: ADEQUATE

## 2023-11-30 ENCOUNTER — Emergency Department (HOSPITAL_COMMUNITY)

## 2023-11-30 ENCOUNTER — Observation Stay (HOSPITAL_COMMUNITY)
Admission: EM | Admit: 2023-11-30 | Discharge: 2023-12-01 | Disposition: A | Discharge status: Home / Self Care | Attending: Internal Medicine | Admitting: Internal Medicine

## 2023-11-30 ENCOUNTER — Encounter (HOSPITAL_COMMUNITY): Payer: Self-pay | Admitting: Emergency Medicine

## 2023-11-30 DIAGNOSIS — M341 CR(E)ST syndrome: Secondary | ICD-10-CM | POA: Diagnosis present

## 2023-11-30 DIAGNOSIS — Z87891 Personal history of nicotine dependence: Secondary | ICD-10-CM | POA: Insufficient documentation

## 2023-11-30 DIAGNOSIS — I10 Essential (primary) hypertension: Secondary | ICD-10-CM | POA: Diagnosis present

## 2023-11-30 DIAGNOSIS — J441 Chronic obstructive pulmonary disease with (acute) exacerbation: Secondary | ICD-10-CM | POA: Diagnosis not present

## 2023-11-30 DIAGNOSIS — Z7901 Long term (current) use of anticoagulants: Secondary | ICD-10-CM | POA: Diagnosis not present

## 2023-11-30 DIAGNOSIS — Z794 Long term (current) use of insulin: Secondary | ICD-10-CM | POA: Insufficient documentation

## 2023-11-30 DIAGNOSIS — B372 Candidiasis of skin and nail: Secondary | ICD-10-CM | POA: Insufficient documentation

## 2023-11-30 DIAGNOSIS — N179 Acute kidney failure, unspecified: Principal | ICD-10-CM | POA: Diagnosis present

## 2023-11-30 DIAGNOSIS — Z1152 Encounter for screening for COVID-19: Secondary | ICD-10-CM | POA: Insufficient documentation

## 2023-11-30 DIAGNOSIS — Z6841 Body Mass Index (BMI) 40.0 and over, adult: Secondary | ICD-10-CM | POA: Diagnosis not present

## 2023-11-30 DIAGNOSIS — E11649 Type 2 diabetes mellitus with hypoglycemia without coma: Secondary | ICD-10-CM | POA: Insufficient documentation

## 2023-11-30 DIAGNOSIS — J9611 Chronic respiratory failure with hypoxia: Secondary | ICD-10-CM | POA: Diagnosis present

## 2023-11-30 DIAGNOSIS — Z79899 Other long term (current) drug therapy: Secondary | ICD-10-CM | POA: Insufficient documentation

## 2023-11-30 DIAGNOSIS — E162 Hypoglycemia, unspecified: Secondary | ICD-10-CM

## 2023-11-30 DIAGNOSIS — E119 Type 2 diabetes mellitus without complications: Secondary | ICD-10-CM

## 2023-11-30 DIAGNOSIS — R0602 Shortness of breath: Secondary | ICD-10-CM | POA: Diagnosis present

## 2023-11-30 LAB — TROPONIN I (HIGH SENSITIVITY)
Troponin I (High Sensitivity): 20 ng/L — ABNORMAL HIGH (ref <18)
Troponin I (High Sensitivity): 19 ng/L — ABNORMAL HIGH (ref <18)

## 2023-11-30 LAB — CBC WITH DIFFERENTIAL/PLATELET
Abs Immature Granulocytes: 0.06 10*3/uL (ref 0.00–0.07)
Basophils Absolute: 0 10*3/uL (ref 0.0–0.1)
Basophils Relative: 0 %
Eosinophils Absolute: 0.2 10*3/uL (ref 0.0–0.5)
Eosinophils Relative: 2 %
HCT: 32 % — ABNORMAL LOW (ref 36.0–46.0)
Hemoglobin: 9.7 g/dL — ABNORMAL LOW (ref 12.0–15.0)
Immature Granulocytes: 1 %
Lymphocytes Relative: 15 %
Lymphs Abs: 1.5 10*3/uL (ref 0.7–4.0)
MCH: 31.8 pg (ref 26.0–34.0)
MCHC: 30.3 g/dL (ref 30.0–36.0)
MCV: 104.9 fL — ABNORMAL HIGH (ref 80.0–100.0)
Monocytes Absolute: 0.7 10*3/uL (ref 0.1–1.0)
Monocytes Relative: 7 %
Neutro Abs: 7.1 10*3/uL (ref 1.7–7.7)
Neutrophils Relative %: 75 %
Platelets: 194 10*3/uL (ref 150–400)
RBC: 3.05 MIL/uL — ABNORMAL LOW (ref 3.87–5.11)
RDW: 15 % (ref 11.5–15.5)
WBC: 9.6 10*3/uL (ref 4.0–10.5)
nRBC: 0.3 % — ABNORMAL HIGH (ref 0.0–0.2)

## 2023-11-30 LAB — COMPREHENSIVE METABOLIC PANEL WITH GFR
ALT: 29 U/L (ref 0–44)
AST: 38 U/L (ref 15–41)
Albumin: 3.5 g/dL (ref 3.5–5.0)
Alkaline Phosphatase: 47 U/L (ref 38–126)
Anion gap: 11 (ref 5–15)
BUN: 31 mg/dL — ABNORMAL HIGH (ref 6–20)
CO2: 43 mmol/L — ABNORMAL HIGH (ref 22–32)
Calcium: 9.8 mg/dL (ref 8.9–10.3)
Chloride: 85 mmol/L — ABNORMAL LOW (ref 98–111)
Creatinine, Ser: 1.65 mg/dL — ABNORMAL HIGH (ref 0.44–1.00)
GFR, Estimated: 36 mL/min — ABNORMAL LOW (ref >60)
Glucose, Bld: 57 mg/dL — ABNORMAL LOW (ref 70–99)
Potassium: 4.4 mmol/L (ref 3.5–5.1)
Sodium: 139 mmol/L (ref 135–145)
Total Bilirubin: 0.6 mg/dL (ref 0.0–1.2)
Total Protein: 6.7 g/dL (ref 6.5–8.1)

## 2023-11-30 LAB — GLUCOSE, CAPILLARY
Glucose-Capillary: 172 mg/dL — ABNORMAL HIGH (ref 70–99)
Glucose-Capillary: 417 mg/dL — ABNORMAL HIGH (ref 70–99)

## 2023-11-30 LAB — RESP PANEL BY RT-PCR (RSV, FLU A&B, COVID)  RVPGX2
Influenza A by PCR: NEGATIVE
Influenza B by PCR: NEGATIVE
Resp Syncytial Virus by PCR: NEGATIVE
SARS Coronavirus 2 by RT PCR: NEGATIVE

## 2023-11-30 LAB — BRAIN NATRIURETIC PEPTIDE: B Natriuretic Peptide: 53 pg/mL (ref 0.0–100.0)

## 2023-11-30 LAB — BLOOD GAS, VENOUS
Acid-Base Excess: 26.2 mmol/L — ABNORMAL HIGH (ref 0.0–2.0)
Bicarbonate: 55.5 mmol/L — ABNORMAL HIGH (ref 20.0–28.0)
Drawn by: 51244
O2 Saturation: 81.6 %
Patient temperature: 36.8
pCO2, Ven: 77 mmHg (ref 44–60)
pH, Ven: 7.46 — ABNORMAL HIGH (ref 7.25–7.43)
pO2, Ven: 45 mmHg (ref 32–45)

## 2023-11-30 LAB — MAGNESIUM: Magnesium: 2 mg/dL (ref 1.7–2.4)

## 2023-11-30 MED ORDER — INSULIN GLARGINE-YFGN 100 UNIT/ML ~~LOC~~ SOLN
30.0000 [IU] | Freq: Two times a day (BID) | SUBCUTANEOUS | Status: DC
  Administered 2023-11-30 – 2023-12-01 (×2): 30 [IU] via SUBCUTANEOUS
  Filled 2023-11-30 (×4): qty 0.3

## 2023-11-30 MED ORDER — LACTATED RINGERS IV SOLN: INTRAVENOUS | Status: AC

## 2023-11-30 MED ORDER — HEPARIN SODIUM (PORCINE) 5000 UNIT/ML IJ SOLN
5000.0000 [IU] | Freq: Three times a day (TID) | INTRAMUSCULAR | Status: DC
  Administered 2023-11-30 – 2023-12-01 (×2): 5000 [IU] via SUBCUTANEOUS
  Filled 2023-11-30 (×2): qty 1

## 2023-11-30 MED ORDER — HYDROXYCHLOROQUINE SULFATE 200 MG PO TABS
400.0000 mg | ORAL_TABLET | Freq: Every day | ORAL | Status: DC
  Administered 2023-12-01: 400 mg via ORAL
  Filled 2023-11-30: qty 2

## 2023-11-30 MED ORDER — ALBUTEROL SULFATE (2.5 MG/3ML) 0.083% IN NEBU
2.5000 mg | INHALATION_SOLUTION | Freq: Once | RESPIRATORY_TRACT | Status: AC
Start: 2023-11-30 — End: 2023-11-30
  Administered 2023-11-30: 2.5 mg via RESPIRATORY_TRACT
  Filled 2023-11-30: qty 3

## 2023-11-30 MED ORDER — POLYETHYLENE GLYCOL 3350 17 G PO PACK: 17.0000 g | PACK | Freq: Every day | ORAL | Status: DC | PRN

## 2023-11-30 MED ORDER — NYSTATIN 100000 UNIT/GM EX POWD
Freq: Three times a day (TID) | CUTANEOUS | Status: DC
  Filled 2023-11-30: qty 15

## 2023-11-30 MED ORDER — IPRATROPIUM-ALBUTEROL 0.5-2.5 (3) MG/3ML IN SOLN
3.0000 mL | RESPIRATORY_TRACT | Status: DC | PRN
  Administered 2023-12-01: 3 mL via RESPIRATORY_TRACT
  Filled 2023-11-30: qty 3

## 2023-11-30 MED ORDER — OXYCODONE-ACETAMINOPHEN 5-325 MG PO TABS
2.0000 | ORAL_TABLET | Freq: Once | ORAL | Status: AC
  Administered 2023-11-30: 2 via ORAL
  Filled 2023-11-30: qty 2

## 2023-11-30 MED ORDER — ESTROGENS CONJUGATED 0.625 MG PO TABS
0.6250 mg | ORAL_TABLET | Freq: Every day | ORAL | Status: DC
  Administered 2023-12-01: 0.625 mg via ORAL
  Filled 2023-11-30 (×2): qty 1

## 2023-11-30 MED ORDER — PREDNISONE 20 MG PO TABS: 40.0000 mg | ORAL_TABLET | Freq: Every day | ORAL | Status: DC

## 2023-11-30 MED ORDER — ONDANSETRON HCL 4 MG PO TABS: 4.0000 mg | ORAL_TABLET | Freq: Four times a day (QID) | ORAL | Status: DC | PRN

## 2023-11-30 MED ORDER — ATENOLOL 25 MG PO TABS
25.0000 mg | ORAL_TABLET | Freq: Every day | ORAL | Status: DC
  Administered 2023-11-30 – 2023-12-01 (×2): 25 mg via ORAL
  Filled 2023-11-30 (×2): qty 1

## 2023-11-30 MED ORDER — GABAPENTIN 300 MG PO CAPS
600.0000 mg | ORAL_CAPSULE | Freq: Three times a day (TID) | ORAL | Status: DC
  Administered 2023-11-30: 600 mg via ORAL
  Filled 2023-11-30 (×6): qty 2

## 2023-11-30 MED ORDER — LACTATED RINGERS IV SOLN: INTRAVENOUS | Status: DC

## 2023-11-30 MED ORDER — METHYLPREDNISOLONE SODIUM SUCC 125 MG IJ SOLR: 60.0000 mg | Freq: Two times a day (BID) | INTRAMUSCULAR | Status: DC

## 2023-11-30 MED ORDER — HYDROMORPHONE HCL 1 MG/ML IJ SOLN
0.5000 mg | Freq: Once | INTRAMUSCULAR | Status: AC
  Administered 2023-11-30: 0.5 mg via INTRAVENOUS
  Filled 2023-11-30: qty 0.5

## 2023-11-30 MED ORDER — CARISOPRODOL 350 MG PO TABS
350.0000 mg | ORAL_TABLET | Freq: Three times a day (TID) | ORAL | Status: DC
  Administered 2023-11-30 – 2023-12-01 (×3): 350 mg via ORAL
  Filled 2023-11-30 (×3): qty 1

## 2023-11-30 MED ORDER — METHOCARBAMOL 500 MG PO TABS
1000.0000 mg | ORAL_TABLET | Freq: Once | ORAL | Status: AC
  Administered 2023-11-30: 1000 mg via ORAL
  Filled 2023-11-30: qty 2

## 2023-11-30 MED ORDER — PANTOPRAZOLE SODIUM 40 MG PO TBEC
40.0000 mg | DELAYED_RELEASE_TABLET | Freq: Every day | ORAL | Status: DC
  Administered 2023-11-30 – 2023-12-01 (×2): 40 mg via ORAL
  Filled 2023-11-30 (×3): qty 1

## 2023-11-30 MED ORDER — ALBUTEROL SULFATE (2.5 MG/3ML) 0.083% IN NEBU: 5.0000 mg | INHALATION_SOLUTION | Freq: Once | RESPIRATORY_TRACT | Status: DC

## 2023-11-30 MED ORDER — INSULIN ASPART 100 UNIT/ML IJ SOLN
15.0000 [IU] | Freq: Once | INTRAMUSCULAR | Status: AC
  Administered 2023-11-30: 15 [IU] via SUBCUTANEOUS

## 2023-11-30 MED ORDER — INSULIN ASPART 100 UNIT/ML IJ SOLN
0.0000 [IU] | Freq: Three times a day (TID) | INTRAMUSCULAR | Status: DC
  Administered 2023-12-01 (×2): 8 [IU] via SUBCUTANEOUS
  Administered 2023-12-01: 5 [IU] via SUBCUTANEOUS

## 2023-11-30 MED ORDER — DOXYCYCLINE HYCLATE 100 MG PO TABS
100.0000 mg | ORAL_TABLET | Freq: Two times a day (BID) | ORAL | Status: DC
  Administered 2023-11-30 – 2023-12-01 (×2): 100 mg via ORAL
  Filled 2023-11-30 (×2): qty 1

## 2023-11-30 MED ORDER — ONDANSETRON HCL 4 MG/2ML IJ SOLN: 4.0000 mg | Freq: Four times a day (QID) | INTRAMUSCULAR | Status: DC | PRN

## 2023-11-30 MED ORDER — METHYLPREDNISOLONE SODIUM SUCC 125 MG IJ SOLR
125.0000 mg | Freq: Once | INTRAMUSCULAR | Status: AC
  Administered 2023-11-30: 125 mg via INTRAVENOUS
  Filled 2023-11-30: qty 2

## 2023-11-30 MED ORDER — INSULIN ASPART 100 UNIT/ML IJ SOLN: 0.0000 [IU] | Freq: Every day | INTRAMUSCULAR | Status: DC

## 2023-11-30 MED ORDER — ACETAMINOPHEN 650 MG RE SUPP: 650.0000 mg | Freq: Four times a day (QID) | RECTAL | Status: DC | PRN

## 2023-11-30 MED ORDER — OXYCODONE HCL 5 MG PO TABS
10.0000 mg | ORAL_TABLET | Freq: Four times a day (QID) | ORAL | Status: DC | PRN
  Administered 2023-11-30 – 2023-12-01 (×4): 10 mg via ORAL
  Filled 2023-11-30 (×4): qty 2

## 2023-11-30 MED ORDER — IPRATROPIUM-ALBUTEROL 0.5-2.5 (3) MG/3ML IN SOLN
3.0000 mL | Freq: Four times a day (QID) | RESPIRATORY_TRACT | Status: AC
  Administered 2023-11-30: 3 mL via RESPIRATORY_TRACT
  Filled 2023-11-30: qty 3

## 2023-11-30 MED ORDER — GUAIFENESIN-DM 100-10 MG/5ML PO SYRP
15.0000 mL | ORAL_SOLUTION | Freq: Three times a day (TID) | ORAL | Status: AC
  Administered 2023-11-30 – 2023-12-01 (×3): 15 mL via ORAL
  Filled 2023-11-30 (×3): qty 15

## 2023-11-30 MED ORDER — ACETAMINOPHEN 325 MG PO TABS: 650.0000 mg | ORAL_TABLET | Freq: Four times a day (QID) | ORAL | Status: DC | PRN

## 2023-11-30 NOTE — Assessment & Plan Note (Addendum)
-   Resume Plaquenil -Currently on IV Solu-Medrol, hold home oral steroids -Resume carisoprodol

## 2023-11-30 NOTE — ED Triage Notes (Signed)
 More shortness of breath and wheezing x 24 hours, elevated bp x 2 days, sharp "lighting bolt pains" in bilateral knees for the past few days, RT hip pain x 2 weeks after a fall.

## 2023-11-30 NOTE — Assessment & Plan Note (Addendum)
 Creatinine elevated 1.65, baseline 0.8-0.9.  No GI losses.  Likely from Lasix, losartan and HCTZ. -Hold Lasix losartan and HCTZ - Continue Lr 100cc/hr x 20 hrs

## 2023-11-30 NOTE — Assessment & Plan Note (Signed)
 Stable.  -Resume atenolol -Lasix losartan and HCTZ held for AKI

## 2023-11-30 NOTE — Assessment & Plan Note (Addendum)
 Presenting with dyspnea, productive cough, wheezing.  On my evaluation patient is status post albuterol nebs with improvement in wheezing.  Chest x-ray minimal bibasilar subsegmental atelectasis.  No increased O2 demands, with sats greater than 92% on home 6 L. -DuoNebs as needed and scheduled -IV Solu-Medrol 125 mg given, continue 60 twice daily -Monitor glucose oral steroids -Azithromycin daily -On daily prednisone 10 mg daily, held for now

## 2023-11-30 NOTE — Assessment & Plan Note (Signed)
 On 6L

## 2023-11-30 NOTE — H&P (Addendum)
 History and Physical    Jennifer Odonnell ZOX:[id number] DOB: 1965/10/21 DOA: 11/30/2023  PCP: Aggie Cosier, MD   Patient coming from: Home  I have personally briefly reviewed patient's old medical records in [facility]  Chief Complaint: Difficulty breathing  HPI: Jennifer Odonnell is a 58 y.o. 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 Patient presented to the ED with 1 day of difficulty breathing, worsening cough productive of brownish sputum, and wheezing.  She has chronic bilateral lower extremity edema that intermittently worsens, but is at baseline today.  She also complains of bilateral knee pains the past several days, and right hip pain over the past 2 weeks after a fall.  She ambulates with a wheelchair outside her house, but she is reports she is unable to use the wheelchair inside her house, but she uses walls and surfaces for support to ambulate inside her house.  She has been compliant with her medications which include Lasix 40 mg daily.  No vomiting no diarrhea.  ED Course: Temperature 98.8.  Heart rate 79-80.  Respiratory 20-22.  Blood pressure systolic 115-135.  O2 sats 92 - 95 percent on 6 L.   Creatinine elevated 1.65.  Blood sugars 57. Chest x-ray shows atelectasis. Pelvic x-ray negative for acute abnormality. Head CT shows old lacunar infarct. CT chest abdomen and pelvis without contrast negative for acute abnormality. Cervical lumbar and thoracic CT spine without acute abnormality. Albuterol neb given, 125 mg Solu-Medrol given, LR at 100 cc/h started.  Review of Systems: As per HPI all other systems reviewed and negative.  Past Medical History:  Diagnosis Date   Arthritis    Cancer (HCC)    cervical cancer at age 65   COPD (chronic obstructive pulmonary disease) (HCC)    CREST syndrome (HCC)    Degenerative disorder of bone    Diabetes  mellitus without complication (HCC)    Hypertension    Neuropathy    Requires continuous at home supplemental oxygen     Past Surgical History:  Procedure Laterality Date   ABDOMINAL HYSTERECTOMY     BLADDER EXTROPHY RECONSTRUCTION PELVIC SAGITTAL OSTEOTOMY  2020   KNEE ARTHROSCOPY WITH PATELLA RECONSTRUCTION Bilateral    PARTIAL HYSTERECTOMY  07/2014   TONSILLECTOMY       reports that she has quit smoking. Her smoking use included cigarettes. She has a 30 pack-year smoking history. She has never used smokeless tobacco. She reports that she does not currently use alcohol. She reports that she does not currently use drugs.  Allergies  Allergen Reactions   Daucus Carota Swelling    Raw carrots   Lisinopril Anaphylaxis   Tiotropium Swelling   Erythromycin Diarrhea   Ambien [Zolpidem] Other (See Comments)    Woke up disoriented and scared.   Spiriva Handihaler [Tiotropium Bromide Monohydrate]     Family History  Problem Relation Age of Onset   Asthma Mother    Emphysema Mother    Arthritis Mother    Hyperlipidemia Mother    Diabetes Mother    Hypertension Mother    Heart disease Father    Heart attack Father    Cancer Father        throat   Hyperlipidemia Father    Alcohol abuse Father    Arthritis Father    Asthma Sister    Emphysema Sister    Seizures Sister    Hypertension Sister    Arthritis  Brother    Hypertension Brother    Thyroid disease Daughter    Prior to Admission medications   Medication Sig Start Date End Date Taking? Authorizing Provider  ADVAIR DISKUS 250-50 MCG/ACT AEPB 1 puff 2 (two) times daily. 05/07/23   [provider]  albuterol (PROVENTIL HFA;VENTOLIN HFA) 108 (90 BASE) MCG/ACT inhaler Inhale 2 puffs into the lungs every 6 (six) hours as needed for wheezing or shortness of breath.    [provider]  albuterol (PROVENTIL) (2.5 MG/3ML) 0.083% nebulizer solution Take 3 mLs (2.5 mg total) by nebulization every 4 (four) hours as  needed for wheezing or shortness of breath. 09/11/20   Johnson, [name], MD  ascorbic acid (VITAMIN C) 500 MG tablet Take 500 mg by mouth daily.    [provider]  aspirin 81 MG EC tablet Take 81 mg by mouth daily.    [provider]  atenolol (TENORMIN) 25 MG tablet Take 25 mg by mouth daily. 08/14/20   [provider]  buPROPion (WELLBUTRIN XL) 300 MG 24 hr tablet Take 300 mg by mouth daily. 05/07/23   [provider]  carisoprodol (SOMA) 350 MG tablet Take 350 mg by mouth 3 (three) times daily. 08/04/20   [provider]  cefdinir (OMNICEF) 300 MG capsule Take 300 mg by mouth every 12 (twelve) hours. 09/20/23   [provider]  colchicine 0.6 MG tablet Take 0.6 mg by mouth daily. 09/04/20   [provider]  cyclobenzaprine (FLEXERIL) 10 MG tablet Take 10 mg by mouth 3 (three) times daily. 05/07/23   [provider]  docusate sodium (COLACE) 100 MG capsule Take 100 mg by mouth 2 (two) times daily.    [provider]  fenofibrate (TRICOR) 48 MG tablet Take 48 mg by mouth daily. 08/14/20   [provider]  FEROSUL 325 (65 Fe) MG tablet Take 325 mg by mouth daily. 08/15/20   [provider]  folic acid (FOLVITE) 1 MG tablet Take 1 mg by mouth daily. 08/14/20   [provider]  furosemide (LASIX) 20 MG tablet Take 20 mg by mouth daily. 09/08/23   [provider]  furosemide (LASIX) 40 MG tablet Take 1 tablet (40 mg total) by mouth daily. 09/07/23   Johnson, [name], MD  gabapentin (NEURONTIN) 600 MG tablet Take 600 mg by mouth 3 (three) times daily. 04/15/23   [provider]  glipiZIDE (GLUCOTROL XL) 2.5 MG 24 hr tablet Take 2.5 mg by mouth every morning. 08/14/20   [provider]  HUMULIN R 100 UNIT/ML injection Inject 30 Units into the skin 3 (three) times daily before meals. 05/08/23   [provider]  hydroxychloroquine (PLAQUENIL) 200 MG tablet Take 400 mg by  mouth daily. 08/14/20   [provider]  ibuprofen (ADVIL) 800 MG tablet Take 800 mg by mouth 2 (two) times daily as needed. 05/07/23   [provider]  indomethacin (INDOCIN) 50 MG capsule Take 50 mg by mouth 3 (three) times daily. 05/07/23   [provider]  ipratropium-albuterol (DUONEB) 0.5-2.5 (3) MG/3ML SOLN Take 3 mLs by nebulization 3 (three) times daily. 08/14/20   [provider]  LANTUS SOLOSTAR 100 UNIT/ML Solostar Pen Inject 68 Units into the skin 2 (two) times daily. 09/04/20   [provider]  losartan-hydrochlorothiazide (HYZAAR) 100-12.5 MG tablet Take 1 tablet by mouth daily. 08/14/20   [provider]  methotrexate (RHEUMATREX) 2.5 MG tablet Take 12.5 mg by mouth once a week.  09/08/23   [provider]  montelukast (SINGULAIR) 10 MG tablet Take 10 mg by mouth daily. 05/07/23   [provider]  Multiple Vitamins-Calcium (ONE-A-DAY WOMENS FORMULA) TABS Take 1 tablet by mouth daily.    [provider]  nystatin (MYCOSTATIN/NYSTOP) powder Apply topically 2 (two) times daily. 02/15/23   [provider]  nystatin cream (MYCOSTATIN) Apply topically 2 (two) times daily. 05/07/23   [provider]  nystatin ointment (MYCOSTATIN) Apply 1 application topically 2 (two) times daily.    [provider]  omeprazole (PRILOSEC) 40 MG capsule Take 40 mg by mouth daily. 08/15/20   [provider]  ondansetron (ZOFRAN-ODT) 4 MG disintegrating tablet Take 4 mg by mouth 3 (three) times daily as needed. 06/02/23   [provider]  Oxycodone HCl 10 MG TABS Take 10 mg by mouth 4 (four) times daily. 08/25/20   [provider]  predniSONE (DELTASONE) 10 MG tablet Take by mouth. 09/20/23   [provider]  predniSONE (DELTASONE) 20 MG tablet Take 2 tablets (40 mg total) by mouth daily with breakfast. Take 2 PO QAM x5 days 09/07/23   [name], [name], MD  PREMARIN 0.625 MG tablet  Take 0.625 mg by mouth daily. 08/14/20   [provider]  promethazine (PHENERGAN) 25 MG tablet Take 25 mg by mouth every 12 (twelve) hours.    [provider]  promethazine-dextromethorphan (PROMETHAZINE-DM) 6.25-15 MG/5ML syrup Take 5 mLs by mouth 3 (three) times daily as needed. 06/02/23   [provider]  sertraline (ZOLOFT) 100 MG tablet Take 100 mg by mouth daily. 09/08/23   [provider]  sertraline (ZOLOFT) 50 MG tablet Take 50 mg by mouth daily. 05/08/23   [provider]  simvastatin (ZOCOR) 40 MG tablet Take 40 mg by mouth every evening. 08/14/20   [provider]  sitaGLIPtin (JANUVIA) 100 MG tablet Take 100 mg by mouth daily.    [provider]  Vitamin D, Ergocalciferol, (DRISDOL) 1.25 MG (50000 UNIT) CAPS capsule Take 50,000 Units by mouth once a week. 08/14/20   [provider]    Physical Exam: Vitals:   11/30/23 1213 11/30/23 1215 11/30/23 1320 11/30/23 1620  BP:  137/60 115/70   Pulse:  79 88   Resp:  20 (!) 22   Temp:  98.8 F (37.1 C)  98.5 F (36.9 C)  TempSrc:  Oral  Oral  SpO2:  95% 92%   Weight: (!) 158.8 kg     Height: 5\' 6"  (1.676 m)       Constitutional: NAD, calm, comfortable Vitals:   11/30/23 1213 11/30/23 1215 11/30/23 1320 11/30/23 1620  BP:  137/60 115/70   Pulse:  79 88   Resp:  20 (!) 22   Temp:  98.8 F (37.1 C)  98.5 F (36.9 C)  TempSrc:  Oral  Oral  SpO2:  95% 92%   Weight: (!) 158.8 kg     Height: 5\' 6"  (1.676 m)      Eyes: PERRL, lids and conjunctivae normal ENMT: Mucous membranes are moist.   Neck: normal, supple, no masses, no thyromegaly Respiratory: clear to auscultation bilaterally, no wheezing, no crackles. Normal respiratory effort. No accessory muscle use.  Cardiovascular: Regular rate and rhythm, no murmurs / rubs / gallops.   Trace pitting bilateral lower extremity edema.  Extremities warm. Abdomen: no tenderness, no masses palpated. No  hepatosplenomegaly. Bowel sounds positive.  Musculoskeletal: no clubbing / cyanosis. No joint deformity upper and lower  extremities.  Skin: Yeastlike appearing erythema appears to be improving beneath bilateral breasts worse on the left, also involving groin areas underneath abdominal pannus.   Neurologic: No facial asymmetry, moving extremities spontaneously, speech fluent Psychiatric: Normal judgment and insight. Alert and oriented x 3. Normal mood.   Labs on Admission: I have personally reviewed following labs and imaging studies  CBC: Recent Labs  Lab 11/30/23 1338  WBC 9.6  NEUTROABS 7.1  HGB 9.7*  HCT 32.0*  MCV 104.9*  PLT 194   Basic Metabolic Panel: Recent Labs  Lab 11/30/23 1338  NA 139  K 4.4  CL 85*  CO2 43*  GLUCOSE 57*  BUN 31*  CREATININE 1.65*  CALCIUM 9.8  MG 2.0   Liver Function Tests: Recent Labs  Lab 11/30/23 1338  AST 38  ALT 29  ALKPHOS 47  BILITOT 0.6  PROT 6.7  ALBUMIN 3.5   Radiological Exams on Admission: CT Cervical Spine Wo Contrast Result Date: 11/30/2023 CLINICAL DATA:  Neck pain.  Fell 2 weeks ago. EXAM: CT HEAD WITHOUT CONTRAST CT CERVICAL SPINE WITHOUT CONTRAST TECHNIQUE: Multidetector CT imaging of the head and cervical spine was performed following the standard protocol without intravenous contrast. Multiplanar CT image reconstructions of the cervical spine were also generated. RADIATION DOSE REDUCTION: This exam was performed according to the departmental dose-optimization program which includes automated exposure control, adjustment of the mA and/or kV according to patient size and/or use of iterative reconstruction technique. COMPARISON:  None Available. FINDINGS: CT HEAD FINDINGS Brain: No evidence of acute infarction, hemorrhage, hydrocephalus, extra-axial collection or mass lesion/mass effect. 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 Vascular: Atherosclerotic vascular calcification of the carotid siphons. No hyperdense vessel.  Skull: Normal. Negative for fracture or focal lesion. Sinuses/Orbits: No acute finding. The paranasal sinuses are clear. Partial opacification both mastoid air cells. The orbits are unremarkable. Other: None. CT CERVICAL SPINE FINDINGS Alignment: Reversal of the normal cervical lordosis. Mild anterolisthesis C3-C4. Mild retrolisthesis at C5-C6. Skull base and vertebrae: No acute fracture. No primary bone lesion or focal pathologic process. Soft tissues and spinal canal: No prevertebral fluid or swelling. No visible canal hematoma. Medial deviation of the cervical internal carotid arteries. Disc levels: Multilevel disc height loss and uncovertebral hypertrophy, worst at C5-C6 and C6-C7. Diffuse facet arthropathy. Upper chest: Negative. Other: None. IMPRESSION: 1. No acute intracranial abnormality. 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 2. No acute cervical spine fracture or traumatic listhesis. Electronically Signed   By: Obie Dredge M.D.   On: 11/30/2023 13:57   CT Head Wo Contrast Result Date: 11/30/2023 CLINICAL DATA:  Neck pain.  Fell 2 weeks ago. EXAM: CT HEAD WITHOUT CONTRAST CT CERVICAL SPINE WITHOUT CONTRAST TECHNIQUE: Multidetector CT imaging of the head and cervical spine was performed following the standard protocol without intravenous contrast. Multiplanar CT image reconstructions of the cervical spine were also generated. RADIATION DOSE REDUCTION: This exam was performed according to the departmental dose-optimization program which includes automated exposure control, adjustment of the mA and/or kV according to patient size and/or use of iterative reconstruction technique. COMPARISON:  None Available. FINDINGS: CT HEAD FINDINGS Brain: No evidence of acute infarction, hemorrhage, hydrocephalus, extra-axial collection or mass lesion/mass effect. 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 Vascular: Atherosclerotic vascular calcification of the carotid siphons. No hyperdense vessel. Skull: Normal.  Negative for fracture or focal lesion. Sinuses/Orbits: No acute finding. The paranasal sinuses are clear. Partial opacification both mastoid air cells. The orbits are unremarkable. Other: None. CT CERVICAL  SPINE FINDINGS Alignment: Reversal of the normal cervical lordosis. Mild anterolisthesis C3-C4. Mild retrolisthesis at C5-C6. Skull base and vertebrae: No acute fracture. No primary bone lesion or focal pathologic process. Soft tissues and spinal canal: No prevertebral fluid or swelling. No visible canal hematoma. Medial deviation of the cervical internal carotid arteries. Disc levels: Multilevel disc height loss and uncovertebral hypertrophy, worst at C5-C6 and C6-C7. Diffuse facet arthropathy. Upper chest: Negative. Other: None. IMPRESSION: 1. No acute intracranial abnormality. 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 2. No acute cervical spine fracture or traumatic listhesis. Electronically Signed   By: Obie Dredge M.D.   On: 11/30/2023 13:57   CT T-SPINE NO CHARGE Result Date: 11/30/2023 CLINICAL DATA:  Radiculopathy.  Recent fall 2 weeks ago. EXAM: CT THORACIC AND LUMBAR SPINE WITHOUT CONTRAST TECHNIQUE: Multiplanar CT images of the thoracic and lumbar spine were reconstructed from contemporary CT of the Chest, Abdomen, and Pelvis. RADIATION DOSE REDUCTION: This exam was performed according to the departmental dose-optimization program which includes automated exposure control, adjustment of the mA and/or kV according to patient size and/or use of iterative reconstruction technique. CONTRAST:  None. COMPARISON:  Chest x-ray dated September 26, 2023. FINDINGS: CT THORACIC SPINE FINDINGS Alignment: Mild levoscoliosis of the upper thoracic spine. No listhesis. Vertebrae: No acute fracture or focal pathologic process. Paraspinal and other soft tissues: Coronary, aortic arch, and branch vessel atherosclerotic vascular disease. Disc levels: Mild multilevel degenerative disc disease. Moderate right-sided  facet arthropathy in the upper thoracic spine. CT LUMBAR SPINE FINDINGS Segmentation: 5 lumbar type vertebrae. Alignment: Normal. Vertebrae: No acute fracture or focal pathologic process. Paraspinal and other soft tissues: Decreased liver density. Aortoiliac atherosclerotic vascular disease. Disc levels: Moderate degenerative disc disease and facet arthropathy at L4-L5 and L5-S1. IMPRESSION: 1. No acute osseous abnormality. 2. Mild multilevel thoracic spondylosis. 3. Moderate lower lumbar spondylosis. Electronically Signed   By: Obie Dredge M.D.   On: 11/30/2023 13:47   CT L-SPINE NO CHARGE Result Date: 11/30/2023 CLINICAL DATA:  Radiculopathy.  Recent fall 2 weeks ago. EXAM: CT THORACIC AND LUMBAR SPINE WITHOUT CONTRAST TECHNIQUE: Multiplanar CT images of the thoracic and lumbar spine were reconstructed from contemporary CT of the Chest, Abdomen, and Pelvis. RADIATION DOSE REDUCTION: This exam was performed according to the departmental dose-optimization program which includes automated exposure control, adjustment of the mA and/or kV according to patient size and/or use of iterative reconstruction technique. CONTRAST:  None. COMPARISON:  Chest x-ray dated September 26, 2023. FINDINGS: CT THORACIC SPINE FINDINGS Alignment: Mild levoscoliosis of the upper thoracic spine. No listhesis. Vertebrae: No acute fracture or focal pathologic process. Paraspinal and other soft tissues: Coronary, aortic arch, and branch vessel atherosclerotic vascular disease. Disc levels: Mild multilevel degenerative disc disease. Moderate right-sided facet arthropathy in the upper thoracic spine. CT LUMBAR SPINE FINDINGS Segmentation: 5 lumbar type vertebrae. Alignment: Normal. Vertebrae: No acute fracture or focal pathologic process. Paraspinal and other soft tissues: Decreased liver density. Aortoiliac atherosclerotic vascular disease. Disc levels: Moderate degenerative disc disease and facet arthropathy at L4-L5 and L5-S1.  IMPRESSION: 1. No acute osseous abnormality. 2. Mild multilevel thoracic spondylosis. 3. Moderate lower lumbar spondylosis. Electronically Signed   By: Obie Dredge M.D.   On: 11/30/2023 13:47   CT CHEST ABDOMEN PELVIS WO CONTRAST Result Date: 11/30/2023 CLINICAL DATA:  Shortness of breath. Right hip pain after fall 2 weeks ago. EXAM: CT CHEST, ABDOMEN AND PELVIS WITHOUT CONTRAST TECHNIQUE: Multidetector CT imaging of the chest, abdomen and pelvis was performed following the  standard protocol without IV contrast. RADIATION DOSE REDUCTION: This exam was performed according to the departmental dose-optimization program which includes automated exposure control, adjustment of the mA and/or kV according to patient size and/or use of iterative reconstruction technique. COMPARISON:  None Available. FINDINGS: CT CHEST FINDINGS Cardiovascular: Atherosclerosis of thoracic aorta without aneurysm formation. Mild cardiomegaly. No pericardial effusion. Minimal coronary artery calcifications are noted. Mediastinum/Nodes: No enlarged mediastinal, hilar, or axillary lymph nodes. Thyroid gland, trachea, and esophagus demonstrate no significant findings. Lungs/Pleura: No pneumothorax or pleural effusion is noted. Minimal bibasilar subsegmental atelectasis is noted. Musculoskeletal: No chest wall mass or suspicious bone lesions identified. CT ABDOMEN PELVIS FINDINGS Hepatobiliary: Hepatic steatosis. No cholelithiasis or biliary dilatation. Pancreas: Unremarkable. No pancreatic ductal dilatation or surrounding inflammatory changes. Spleen: Normal in size without focal abnormality. Adrenals/Urinary Tract: Adrenal glands are unremarkable. Kidneys are normal, without renal calculi, focal lesion, or hydronephrosis. Bladder is unremarkable. Stomach/Bowel: Stomach is unremarkable. There is no evidence of bowel obstruction or inflammation. The appendix is not clearly visualized. Vascular/Lymphatic: Aortic atherosclerosis. No enlarged  abdominal or pelvic lymph nodes. Reproductive: Status post hysterectomy. No adnexal masses. Other: No ascites or hernia is noted. Musculoskeletal: No acute or significant osseous findings. IMPRESSION: Minimal bibasilar subsegmental atelectasis. Hepatic steatosis. No other significant abnormality seen in the chest, abdomen or pelvis. Aortic Atherosclerosis (ICD10-I70.0). Electronically Signed   By: Lupita Raider M.D.   On: 11/30/2023 13:40   DG Hip Unilat W or Wo Pelvis 2-3 Views Right Result Date: 11/30/2023 CLINICAL DATA:  Right hip pain after fall 2 weeks ago. EXAM: DG HIP (WITH OR WITHOUT PELVIS) 2-3V RIGHT COMPARISON:  None Available. FINDINGS: There is no evidence of hip fracture or dislocation. Mild osteophyte formation of right hip is noted. IMPRESSION: Mild degenerative joint disease of right hip. No acute abnormality seen. Electronically Signed   By: Lupita Raider M.D.   On: 11/30/2023 13:24   DG Chest Port 1 View Result Date: 11/30/2023 CLINICAL DATA:  Shortness of breath and wheezing. EXAM: PORTABLE CHEST 1 VIEW COMPARISON:  September 26, 2023. FINDINGS: Stable cardiomegaly. Minimal bibasilar subsegmental atelectasis is noted. Bony thorax is unremarkable. IMPRESSION: Minimal bibasilar subsegmental atelectasis. Electronically Signed   By: Lupita Raider M.D.   On: 11/30/2023 13:22    EKG: Independently reviewed.  Sinus rhythm, rate 84, QTc 392.  Atrial premature complexes.  No significant changes from prior.  Assessment/Plan Principal Problem:   AKI (acute kidney injury) (HCC) Active Problems:   COPD with acute exacerbation (HCC)   CREST syndrome (HCC)   DM2 (diabetes mellitus, type 2) (HCC)   HTN (hypertension)   Chronic hypoxic respiratory failure (HCC)   Assessment and Plan: * AKI (acute kidney injury) (HCC) Creatinine elevated 1.65, baseline 0.8-0.9.  No GI losses.  Likely from Lasix, losartan and HCTZ. -Hold Lasix losartan and HCTZ - Continue Lr 100cc/hr x 20 hrs  COPD  with acute exacerbation (HCC) Presenting with dyspnea, productive cough, wheezing.  On my evaluation patient is status post albuterol nebs with improvement in wheezing.  Chest x-ray minimal bibasilar subsegmental atelectasis.  No increased O2 demands, with sats greater than 92% on home 6 L. -DuoNebs as needed and scheduled -IV Solu-Medrol 125 mg given, continue 60 twice daily -Monitor glucose oral steroids -Azithromycin daily -On daily prednisone 10 mg daily, held for now   Chronic hypoxic respiratory failure (HCC) On 6 L.  HTN (hypertension) Stable.  -Resume atenolol -Lasix losartan and HCTZ held for AKI  DM2 (diabetes mellitus, type  2) (HCC) Hypoglycemic blood sugar down to 57 > 417. - SSI- M - Resume home medications Lantus at reduced dose- 30 u bid (71 mg twice daily, and Humulin 35 units TID). -Hold glipizide, januvia -Resume gabapentin - Hgba1c - monitor glucose on steroids  CREST syndrome (HCC) - Resume Plaquenil -Currently on IV Solu-Medrol, hold home oral steroids -Resume carisoprodol  Falls -PT eval  Polypharmacy  Candida intertrigo - Topical nystatin cream   DVT prophylaxis: Lovenox Code Status: FULL code Family Communication: None at bedside Disposition Plan: ~ 1- 2 days Consults called:  None Admission status:  Obs tele    Author: Onnie Boer, MD 11/30/2023 5:30 PM  For on call review [email or web address].

## 2023-11-30 NOTE — ED Provider Notes (Addendum)
 [place] EMERGENCY DEPARTMENT AT [facility] Provider Note   CSN: [id number] Arrival date & time: 11/30/23  1201     History  No chief complaint on file.   Jennifer Odonnell is a 58 y.o. female.  HPI Patient presents for multiple complaints.  Medical history includes DM, HTN, COPD, neuropathy, arthritis, CREST syndrome.  At baseline, she wears 6 L of supplemental oxygen.  She will typically increase her oxygen if she is up and moving around.  At home, she walks slowly and hangs onto walls and furniture.  When she leaves the home, she requires wheelchair.  She does nebulized breathing treatments twice per day.  Patient states that she has had worsened shortness of breath and cough since yesterday.  Cough has been productive of brown sputum.  She also describes intermittent shooting pains in her knees.  She has had surgeries to her knees before.  She has had similar episodes of intermittent shooting pains which she attributes to a cervical degenerative change.  She has been taking her daily Lasix but states that her BLE edema has worsened.  She states that her most recent fall was approximately 2 weeks ago.  At that time, she did strike her right hip.  She has had bruising to the area since then.    Home Medications Prior to Admission medications   Medication Sig Start Date End Date Taking? Authorizing Provider  ADVAIR DISKUS 250-50 MCG/ACT AEPB 1 puff 2 (two) times daily. 05/07/23   [provider]  albuterol (PROVENTIL HFA;VENTOLIN HFA) 108 (90 BASE) MCG/ACT inhaler Inhale 2 puffs into the lungs every 6 (six) hours as needed for wheezing or shortness of breath.    [provider]  albuterol (PROVENTIL) (2.5 MG/3ML) 0.083% nebulizer solution Take 3 mLs (2.5 mg total) by nebulization every 4 (four) hours as needed for wheezing or shortness of breath. 09/11/20   Johnson, [name], MD  ascorbic acid (VITAMIN C) 500 MG tablet Take 500 mg by mouth daily.     [provider]  aspirin 81 MG EC tablet Take 81 mg by mouth daily.    [provider]  atenolol (TENORMIN) 25 MG tablet Take 25 mg by mouth daily. 08/14/20   [provider]  buPROPion (WELLBUTRIN XL) 300 MG 24 hr tablet Take 300 mg by mouth daily. 05/07/23   [provider]  carisoprodol (SOMA) 350 MG tablet Take 350 mg by mouth 3 (three) times daily. 08/04/20   [provider]  cefdinir (OMNICEF) 300 MG capsule Take 300 mg by mouth every 12 (twelve) hours. 09/20/23   [provider]  colchicine 0.6 MG tablet Take 0.6 mg by mouth daily. 09/04/20   [provider]  cyclobenzaprine (FLEXERIL) 10 MG tablet Take 10 mg by mouth 3 (three) times daily. 05/07/23   [provider]  docusate sodium (COLACE) 100 MG capsule Take 100 mg by mouth 2 (two) times daily.    [provider]  fenofibrate (TRICOR) 48 MG tablet Take 48 mg by mouth daily. 08/14/20   [provider]  FEROSUL 325 (65 Fe) MG tablet Take 325 mg by mouth daily. 08/15/20   [provider]  folic acid (FOLVITE) 1 MG tablet Take 1 mg by mouth daily. 08/14/20   [provider]  furosemide (LASIX) 20 MG tablet Take 20 mg by mouth daily. 09/08/23   [provider]  furosemide (LASIX) 40 MG tablet Take 1 tablet (40 mg total) by mouth daily. 09/07/23  Johnson, [name], MD  gabapentin (NEURONTIN) 600 MG tablet Take 600 mg by mouth 3 (three) times daily. 04/15/23   [provider]  glipiZIDE (GLUCOTROL XL) 2.5 MG 24 hr tablet Take 2.5 mg by mouth every morning. 08/14/20   [provider]  HUMULIN R 100 UNIT/ML injection Inject 30 Units into the skin 3 (three) times daily before meals. 05/08/23   [provider]  hydroxychloroquine (PLAQUENIL) 200 MG tablet Take 400 mg by mouth daily. 08/14/20   [provider]  ibuprofen (ADVIL) 800 MG tablet Take 800 mg by mouth 2 (two) times daily as needed. 05/07/23    [provider]  indomethacin (INDOCIN) 50 MG capsule Take 50 mg by mouth 3 (three) times daily. 05/07/23   [provider]  ipratropium-albuterol (DUONEB) 0.5-2.5 (3) MG/3ML SOLN Take 3 mLs by nebulization 3 (three) times daily. 08/14/20   [provider]  LANTUS SOLOSTAR 100 UNIT/ML Solostar Pen Inject 68 Units into the skin 2 (two) times daily. 09/04/20   [provider]  losartan-hydrochlorothiazide (HYZAAR) 100-12.5 MG tablet Take 1 tablet by mouth daily. 08/14/20   [provider]  methotrexate (RHEUMATREX) 2.5 MG tablet Take 12.5 mg by mouth once a week. 09/08/23   [provider]  montelukast (SINGULAIR) 10 MG tablet Take 10 mg by mouth daily. 05/07/23   [provider]  Multiple Vitamins-Calcium (ONE-A-DAY WOMENS FORMULA) TABS Take 1 tablet by mouth daily.    [provider]  nystatin (MYCOSTATIN/NYSTOP) powder Apply topically 2 (two) times daily. 02/15/23   [provider]  nystatin cream (MYCOSTATIN) Apply topically 2 (two) times daily. 05/07/23   [provider]  nystatin ointment (MYCOSTATIN) Apply 1 application topically 2 (two) times daily.    [provider]  omeprazole (PRILOSEC) 40 MG capsule Take 40 mg by mouth daily. 08/15/20   [provider]  ondansetron (ZOFRAN-ODT) 4 MG disintegrating tablet Take 4 mg by mouth 3 (three) times daily as needed. 06/02/23   [provider]  Oxycodone HCl 10 MG TABS Take 10 mg by mouth 4 (four) times daily. 08/25/20   [provider]  predniSONE (DELTASONE) 10 MG tablet Take by mouth. 09/20/23   [provider]  predniSONE (DELTASONE) 20 MG tablet Take 2 tablets (40 mg total) by mouth daily with breakfast. Take 2 PO QAM x5 days 09/07/23   [name], [name], MD  PREMARIN 0.625 MG tablet Take 0.625 mg by mouth daily. 08/14/20   [provider]  promethazine (PHENERGAN) 25 MG tablet Take 25 mg by mouth every 12 (twelve)  hours.    [provider]  promethazine-dextromethorphan (PROMETHAZINE-DM) 6.25-15 MG/5ML syrup Take 5 mLs by mouth 3 (three) times daily as needed. 06/02/23   [provider]  sertraline (ZOLOFT) 100 MG tablet Take 100 mg by mouth daily. 09/08/23   [provider]  sertraline (ZOLOFT) 50 MG tablet Take 50 mg by mouth daily. 05/08/23   [provider]  simvastatin (ZOCOR) 40 MG tablet Take 40 mg by mouth every evening. 08/14/20   [provider]  sitaGLIPtin (JANUVIA) 100 MG tablet Take 100 mg by mouth daily.    [provider]  Vitamin D, Ergocalciferol, (DRISDOL) 1.25 MG (50000 UNIT) CAPS capsule Take 50,000 Units by mouth once a week. 08/14/20   [provider]      Allergies    Daucus carota, Lisinopril, Tiotropium, Erythromycin, Ambien [zolpidem], and Spiriva handihaler [tiotropium bromide monohydrate]    Review of Systems  Review of Systems  Constitutional:  Positive for fatigue.  Respiratory:  Positive for cough, chest tightness, shortness of breath and wheezing.   Cardiovascular:  Positive for leg swelling.  Musculoskeletal:  Positive for arthralgias and neck pain.  Hematological:  Bruises/bleeds easily.  All other systems reviewed and are negative.   Physical Exam Updated Vital Signs BP 115/70   Pulse 88   Temp 98.8 F (37.1 C) (Oral)   Resp (!) 22   Ht 5\' 6"  (1.676 m)   Wt (!) 158.8 kg   SpO2 92%   BMI 56.49 kg/m  Physical Exam Vitals and nursing note reviewed.  Constitutional:      General: She is not in acute distress.    Appearance: She is well-developed. She is obese. She is ill-appearing (Chronically). She is not toxic-appearing or diaphoretic.  HENT:     Head: Normocephalic and atraumatic.     Right Ear: External ear normal.     Left Ear: External ear normal.     Nose: Nose normal.     Mouth/Throat:     Mouth: Mucous membranes are moist.  Eyes:     Extraocular Movements: Extraocular movements  intact.     Conjunctiva/sclera: Conjunctivae normal.  Cardiovascular:     Rate and Rhythm: Normal rate and regular rhythm.     Heart sounds: No murmur heard. Pulmonary:     Effort: Pulmonary effort is normal. Tachypnea present. No respiratory distress.     Breath sounds: Decreased breath sounds and wheezing present. No rales.  Chest:     Chest wall: No tenderness.  Abdominal:     Palpations: Abdomen is soft.     Tenderness: There is no abdominal tenderness.  Musculoskeletal:     Cervical back: Neck supple.     Right lower leg: Edema present.     Left lower leg: Edema present.  Skin:    General: Skin is warm and dry.     Coloration: Skin is not jaundiced or pale.  Neurological:     General: No focal deficit present.     Mental Status: She is alert and oriented to person, place, and time.  Psychiatric:        Mood and Affect: Mood normal.        Behavior: Behavior normal.     ED Results / Procedures / Treatments   Labs (all labs ordered are listed, but only abnormal results are displayed) Labs Reviewed  COMPREHENSIVE METABOLIC PANEL WITH GFR - Abnormal; Notable for the following components:      Result Value   Chloride 85 (*)    CO2 43 (*)    Glucose, Bld 57 (*)    BUN 31 (*)    Creatinine, Ser 1.65 (*)    GFR, Estimated 36 (*)    All other components within normal limits  BLOOD GAS, VENOUS - Abnormal; Notable for the following components:   pH, Ven 7.46 (*)    pCO2, Ven 77 (*)    Bicarbonate 55.5 (*)    Acid-Base Excess 26.2 (*)    All other components within normal limits  CBC WITH DIFFERENTIAL/PLATELET - Abnormal; Notable for the following components:   RBC 3.05 (*)    Hemoglobin 9.7 (*)    HCT 32.0 (*)    MCV 104.9 (*)    nRBC 0.3 (*)    All other components within normal limits  TROPONIN I (HIGH SENSITIVITY) - Abnormal; Notable for the following components:   Troponin I (High Sensitivity) 20 (*)  All other components within normal limits  RESP PANEL BY  RT-PCR (RSV, FLU A&B, COVID)  [id number]  BRAIN NATRIURETIC PEPTIDE  MAGNESIUM  TROPONIN I (HIGH SENSITIVITY)    EKG EKG Interpretation Date/Time:  Sunday November 30 2023 13:40:09 EDT Ventricular Rate:  84 PR Interval:  61 QRS Duration:  82 QT Interval:  337 QTC Calculation: 392 R Axis:   18  Text Interpretation: Sinus rhythm Atrial premature complex Short PR interval Low voltage, precordial leads Minimal ST depression, inferior leads Borderline ST elevation, lateral leads Baseline wander in lead(s) I II III aVR aVL V1 Confirmed by Gloris Manchester [id number]) on 11/30/2023 1:42:38 PM  Radiology CT Cervical Spine Wo Contrast Result Date: 11/30/2023 CLINICAL DATA:  Neck pain.  Fell 2 weeks ago. EXAM: CT HEAD WITHOUT CONTRAST CT CERVICAL SPINE WITHOUT CONTRAST TECHNIQUE: Multidetector CT imaging of the head and cervical spine was performed following the standard protocol without intravenous contrast. Multiplanar CT image reconstructions of the cervical spine were also generated. RADIATION DOSE REDUCTION: This exam was performed according to the departmental dose-optimization program which includes automated exposure control, adjustment of the mA and/or kV according to patient size and/or use of iterative reconstruction technique. COMPARISON:  None Available. FINDINGS: CT HEAD FINDINGS Brain: No evidence of acute infarction, hemorrhage, hydrocephalus, extra-axial collection or mass lesion/mass effect. 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 Vascular: Atherosclerotic vascular calcification of the carotid siphons. No hyperdense vessel. Skull: Normal. Negative for fracture or focal lesion. Sinuses/Orbits: No acute finding. The paranasal sinuses are clear. Partial opacification both mastoid air cells. The orbits are unremarkable. Other: None. CT CERVICAL SPINE FINDINGS Alignment: Reversal of the normal cervical lordosis. Mild anterolisthesis C3-C4. Mild retrolisthesis at C5-C6. Skull base and vertebrae: No acute  fracture. No primary bone lesion or focal pathologic process. Soft tissues and spinal canal: No prevertebral fluid or swelling. No visible canal hematoma. Medial deviation of the cervical internal carotid arteries. Disc levels: Multilevel disc height loss and uncovertebral hypertrophy, worst at C5-C6 and C6-C7. Diffuse facet arthropathy. Upper chest: Negative. Other: None. IMPRESSION: 1. No acute intracranial abnormality. 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 2. No acute cervical spine fracture or traumatic listhesis. Electronically Signed   By: Obie Dredge M.D.   On: 11/30/2023 13:57   CT Head Wo Contrast Result Date: 11/30/2023 CLINICAL DATA:  Neck pain.  Fell 2 weeks ago. EXAM: CT HEAD WITHOUT CONTRAST CT CERVICAL SPINE WITHOUT CONTRAST TECHNIQUE: Multidetector CT imaging of the head and cervical spine was performed following the standard protocol without intravenous contrast. Multiplanar CT image reconstructions of the cervical spine were also generated. RADIATION DOSE REDUCTION: This exam was performed according to the departmental dose-optimization program which includes automated exposure control, adjustment of the mA and/or kV according to patient size and/or use of iterative reconstruction technique. COMPARISON:  None Available. FINDINGS: CT HEAD FINDINGS Brain: No evidence of acute infarction, hemorrhage, hydrocephalus, extra-axial collection or mass lesion/mass effect. 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 Vascular: Atherosclerotic vascular calcification of the carotid siphons. No hyperdense vessel. Skull: Normal. Negative for fracture or focal lesion. Sinuses/Orbits: No acute finding. The paranasal sinuses are clear. Partial opacification both mastoid air cells. The orbits are unremarkable. Other: None. CT CERVICAL SPINE FINDINGS Alignment: Reversal of the normal cervical lordosis. Mild anterolisthesis C3-C4. Mild retrolisthesis at C5-C6. Skull base and vertebrae: No acute fracture. No  primary bone lesion or focal pathologic process. Soft tissues and spinal canal: No prevertebral fluid or swelling. No visible canal hematoma. Medial deviation  of the cervical internal carotid arteries. Disc levels: Multilevel disc height loss and uncovertebral hypertrophy, worst at C5-C6 and C6-C7. Diffuse facet arthropathy. Upper chest: Negative. Other: None. IMPRESSION: 1. No acute intracranial abnormality. 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 2. No acute cervical spine fracture or traumatic listhesis. Electronically Signed   By: Obie Dredge M.D.   On: 11/30/2023 13:57   CT T-SPINE NO CHARGE Result Date: 11/30/2023 CLINICAL DATA:  Radiculopathy.  Recent fall 2 weeks ago. EXAM: CT THORACIC AND LUMBAR SPINE WITHOUT CONTRAST TECHNIQUE: Multiplanar CT images of the thoracic and lumbar spine were reconstructed from contemporary CT of the Chest, Abdomen, and Pelvis. RADIATION DOSE REDUCTION: This exam was performed according to the departmental dose-optimization program which includes automated exposure control, adjustment of the mA and/or kV according to patient size and/or use of iterative reconstruction technique. CONTRAST:  None. COMPARISON:  Chest x-ray dated September 26, 2023. FINDINGS: CT THORACIC SPINE FINDINGS Alignment: Mild levoscoliosis of the upper thoracic spine. No listhesis. Vertebrae: No acute fracture or focal pathologic process. Paraspinal and other soft tissues: Coronary, aortic arch, and branch vessel atherosclerotic vascular disease. Disc levels: Mild multilevel degenerative disc disease. Moderate right-sided facet arthropathy in the upper thoracic spine. CT LUMBAR SPINE FINDINGS Segmentation: 5 lumbar type vertebrae. Alignment: Normal. Vertebrae: No acute fracture or focal pathologic process. Paraspinal and other soft tissues: Decreased liver density. Aortoiliac atherosclerotic vascular disease. Disc levels: Moderate degenerative disc disease and facet arthropathy at L4-L5 and L5-S1.  IMPRESSION: 1. No acute osseous abnormality. 2. Mild multilevel thoracic spondylosis. 3. Moderate lower lumbar spondylosis. Electronically Signed   By: Obie Dredge M.D.   On: 11/30/2023 13:47   CT L-SPINE NO CHARGE Result Date: 11/30/2023 CLINICAL DATA:  Radiculopathy.  Recent fall 2 weeks ago. EXAM: CT THORACIC AND LUMBAR SPINE WITHOUT CONTRAST TECHNIQUE: Multiplanar CT images of the thoracic and lumbar spine were reconstructed from contemporary CT of the Chest, Abdomen, and Pelvis. RADIATION DOSE REDUCTION: This exam was performed according to the departmental dose-optimization program which includes automated exposure control, adjustment of the mA and/or kV according to patient size and/or use of iterative reconstruction technique. CONTRAST:  None. COMPARISON:  Chest x-ray dated September 26, 2023. FINDINGS: CT THORACIC SPINE FINDINGS Alignment: Mild levoscoliosis of the upper thoracic spine. No listhesis. Vertebrae: No acute fracture or focal pathologic process. Paraspinal and other soft tissues: Coronary, aortic arch, and branch vessel atherosclerotic vascular disease. Disc levels: Mild multilevel degenerative disc disease. Moderate right-sided facet arthropathy in the upper thoracic spine. CT LUMBAR SPINE FINDINGS Segmentation: 5 lumbar type vertebrae. Alignment: Normal. Vertebrae: No acute fracture or focal pathologic process. Paraspinal and other soft tissues: Decreased liver density. Aortoiliac atherosclerotic vascular disease. Disc levels: Moderate degenerative disc disease and facet arthropathy at L4-L5 and L5-S1. IMPRESSION: 1. No acute osseous abnormality. 2. Mild multilevel thoracic spondylosis. 3. Moderate lower lumbar spondylosis. Electronically Signed   By: Obie Dredge M.D.   On: 11/30/2023 13:47   CT CHEST ABDOMEN PELVIS WO CONTRAST Result Date: 11/30/2023 CLINICAL DATA:  Shortness of breath. Right hip pain after fall 2 weeks ago. EXAM: CT CHEST, ABDOMEN AND PELVIS WITHOUT CONTRAST  TECHNIQUE: Multidetector CT imaging of the chest, abdomen and pelvis was performed following the standard protocol without IV contrast. RADIATION DOSE REDUCTION: This exam was performed according to the departmental dose-optimization program which includes automated exposure control, adjustment of the mA and/or kV according to patient size and/or use of iterative reconstruction technique. COMPARISON:  None Available. FINDINGS: CT CHEST FINDINGS  Cardiovascular: Atherosclerosis of thoracic aorta without aneurysm formation. Mild cardiomegaly. No pericardial effusion. Minimal coronary artery calcifications are noted. Mediastinum/Nodes: No enlarged mediastinal, hilar, or axillary lymph nodes. Thyroid gland, trachea, and esophagus demonstrate no significant findings. Lungs/Pleura: No pneumothorax or pleural effusion is noted. Minimal bibasilar subsegmental atelectasis is noted. Musculoskeletal: No chest wall mass or suspicious bone lesions identified. CT ABDOMEN PELVIS FINDINGS Hepatobiliary: Hepatic steatosis. No cholelithiasis or biliary dilatation. Pancreas: Unremarkable. No pancreatic ductal dilatation or surrounding inflammatory changes. Spleen: Normal in size without focal abnormality. Adrenals/Urinary Tract: Adrenal glands are unremarkable. Kidneys are normal, without renal calculi, focal lesion, or hydronephrosis. Bladder is unremarkable. Stomach/Bowel: Stomach is unremarkable. There is no evidence of bowel obstruction or inflammation. The appendix is not clearly visualized. Vascular/Lymphatic: Aortic atherosclerosis. No enlarged abdominal or pelvic lymph nodes. Reproductive: Status post hysterectomy. No adnexal masses. Other: No ascites or hernia is noted. Musculoskeletal: No acute or significant osseous findings. IMPRESSION: Minimal bibasilar subsegmental atelectasis. Hepatic steatosis. No other significant abnormality seen in the chest, abdomen or pelvis. Aortic Atherosclerosis (ICD10-I70.0). Electronically  Signed   By: Lupita Raider M.D.   On: 11/30/2023 13:40   DG Hip Unilat W or Wo Pelvis 2-3 Views Right Result Date: 11/30/2023 CLINICAL DATA:  Right hip pain after fall 2 weeks ago. EXAM: DG HIP (WITH OR WITHOUT PELVIS) 2-3V RIGHT COMPARISON:  None Available. FINDINGS: There is no evidence of hip fracture or dislocation. Mild osteophyte formation of right hip is noted. IMPRESSION: Mild degenerative joint disease of right hip. No acute abnormality seen. Electronically Signed   By: Lupita Raider M.D.   On: 11/30/2023 13:24   DG Chest Port 1 View Result Date: 11/30/2023 CLINICAL DATA:  Shortness of breath and wheezing. EXAM: PORTABLE CHEST 1 VIEW COMPARISON:  September 26, 2023. FINDINGS: Stable cardiomegaly. Minimal bibasilar subsegmental atelectasis is noted. Bony thorax is unremarkable. IMPRESSION: Minimal bibasilar subsegmental atelectasis. Electronically Signed   By: Lupita Raider M.D.   On: 11/30/2023 13:22    Procedures Procedures    Medications Ordered in ED Medications  HYDROmorphone (DILAUDID) injection 0.5 mg (has no administration in time range)  lactated ringers infusion (has no administration in time range)  methylPREDNISolone sodium succinate (SOLU-MEDROL) 125 mg/2 mL injection 125 mg (125 mg Intravenous Given 11/30/23 1340)  oxyCODONE-acetaminophen (PERCOCET/ROXICET) 5-325 MG per tablet 2 tablet (2 tablets Oral Given 11/30/23 1320)  methocarbamol (ROBAXIN) tablet 1,000 mg (1,000 mg Oral Given 11/30/23 1320)  albuterol (PROVENTIL) (2.5 MG/3ML) 0.083% nebulizer solution 2.5 mg (2.5 mg Nebulization Given 11/30/23 1320)    ED Course/ Medical Decision Making/ A&P                                 Medical Decision Making Amount and/or Complexity of Data Reviewed Labs: ordered. Radiology: ordered.  Risk Prescription drug management. Decision regarding hospitalization.   This patient presents to the ED for concern of multiple complaints, this involves an extensive number of  treatment options, and is a complaint that carries with it a high risk of complications and morbidity.  The differential diagnosis includes COPD exacerbation, acute injuries, pneumonia, metabolic derangements, worsening of chronic arthritis   Co morbidities that complicate the patient evaluation  DM, HTN, COPD, neuropathy, arthritis, CREST syndrome   Additional history obtained:  Additional history obtained from N/A External records from outside source obtained and reviewed including EMR   Lab Tests:  I Ordered, and personally interpreted labs.  The pertinent results include:  Baseline anemia, no leukocytosis, normal BNP.  An AKI is present.   Imaging Studies ordered:  I ordered imaging studies including x-ray of chest and right hip; CT of head, cervical spine, chest, abdomen, pelvis, T-spine, L-spine I independently visualized and interpreted imaging which showed no acute findings I agree with the radiologist interpretation   Cardiac Monitoring: / EKG:  The patient was maintained on a cardiac monitor.  I personally viewed and interpreted the cardiac monitored which showed an underlying rhythm of: Sinus rhythm  Problem List / ED Course / Critical interventions / Medication management  Patient presenting for multiple complaints, including acute on chronic pain and shortness of breath.  She arrives on her baseline 6 L of oxygen.  SpO2 is in the low to mid 90s.  She is mildly tachypneic but able to speak in complete sentences.  She has diminished breath sounds and some crackles on auscultation.  I suspect multifactorial shortness of breath, as she does have worsened swelling in her lower extremities.  DuoNeb was ordered.  Workup was initiated.  Patient also describes shooting pains to her knees, pain to her right hip.  At home, she takes 10 mg oxycodones.  This has not been helping her pain.  Multimodal pain control was ordered in the ED. patient's lab work was notable for AKI.  She  states that she has been taking frequent doses of 800 mg ibuprofen.  She is also on 40 mg of Lasix and may be over diuresed.  Gentle hydration was ordered.  Her sugar was also found to be low.  She states that this is due to not eating at all today.  She was given some oral glucose replacement.  She did have improvement in her shortness of breath following DuoNeb.  She persistent but improved pain.  Dose of Dilaudid was ordered.  Patient to be admitted for further management. I ordered medication including albuterol and Solu-Medrol for COPD exacerbation; Percocet, Robaxin, Dilaudid for analgesia; IV fluids for hydration Reevaluation of the patient after these medicines showed that the patient improved I have reviewed the patients home medicines and have made adjustments as needed   Social Determinants of Health:  Poor mobility at baseline        Final Clinical Impression(s) / ED Diagnoses Final diagnoses:  COPD exacerbation (HCC)  AKI (acute kidney injury) [facility])    Rx / DC Orders ED Discharge Orders     None         Gloris Manchester, MD 11/30/23 1507    Gloris Manchester, MD 11/30/23 1530

## 2023-11-30 NOTE — Assessment & Plan Note (Addendum)
 Hypoglycemic blood sugar down to 57 > 417. - SSI- M - Resume home medications Lantus at reduced dose- 30 u bid (71 mg twice daily, and Humulin 35 units TID). -Hold glipizide, januvia -Resume gabapentin - Hgba1c - monitor glucose on steroids

## 2023-12-01 DIAGNOSIS — J441 Chronic obstructive pulmonary disease with (acute) exacerbation: Secondary | ICD-10-CM | POA: Diagnosis not present

## 2023-12-01 DIAGNOSIS — I10 Essential (primary) hypertension: Secondary | ICD-10-CM | POA: Diagnosis not present

## 2023-12-01 DIAGNOSIS — N179 Acute kidney failure, unspecified: Secondary | ICD-10-CM | POA: Diagnosis not present

## 2023-12-01 DIAGNOSIS — J9611 Chronic respiratory failure with hypoxia: Secondary | ICD-10-CM | POA: Diagnosis not present

## 2023-12-01 LAB — BASIC METABOLIC PANEL WITH GFR
Anion gap: 16 — ABNORMAL HIGH (ref 5–15)
BUN: 36 mg/dL — ABNORMAL HIGH (ref 6–20)
CO2: 33 mmol/L — ABNORMAL HIGH (ref 22–32)
Calcium: 9.2 mg/dL (ref 8.9–10.3)
Chloride: 85 mmol/L — ABNORMAL LOW (ref 98–111)
Creatinine, Ser: 1.47 mg/dL — ABNORMAL HIGH (ref 0.44–1.00)
GFR, Estimated: 41 mL/min — ABNORMAL LOW (ref >60)
Glucose, Bld: 375 mg/dL — ABNORMAL HIGH (ref 70–99)
Potassium: 5.2 mmol/L — ABNORMAL HIGH (ref 3.5–5.1)
Sodium: 134 mmol/L — ABNORMAL LOW (ref 135–145)

## 2023-12-01 LAB — CBC
HCT: 32 % — ABNORMAL LOW (ref 36.0–46.0)
Hemoglobin: 10 g/dL — ABNORMAL LOW (ref 12.0–15.0)
MCH: 32.5 pg (ref 26.0–34.0)
MCHC: 31.3 g/dL (ref 30.0–36.0)
MCV: 103.9 fL — ABNORMAL HIGH (ref 80.0–100.0)
Platelets: 187 10*3/uL (ref 150–400)
RBC: 3.08 MIL/uL — ABNORMAL LOW (ref 3.87–5.11)
RDW: 15.1 % (ref 11.5–15.5)
WBC: 10.3 10*3/uL (ref 4.0–10.5)
nRBC: 0.5 % — ABNORMAL HIGH (ref 0.0–0.2)

## 2023-12-01 LAB — HEMOGLOBIN A1C
Hgb A1c MFr Bld: 8.6 % — ABNORMAL HIGH (ref 4.8–5.6)
Mean Plasma Glucose: 200.12 mg/dL

## 2023-12-01 LAB — GLUCOSE, CAPILLARY
Glucose-Capillary: 284 mg/dL — ABNORMAL HIGH (ref 70–99)
Glucose-Capillary: 252 mg/dL — ABNORMAL HIGH (ref 70–99)
Glucose-Capillary: 215 mg/dL — ABNORMAL HIGH (ref 70–99)

## 2023-12-01 MED ORDER — GABAPENTIN 300 MG PO CAPS
600.0000 mg | ORAL_CAPSULE | Freq: Three times a day (TID) | ORAL | Status: DC
  Administered 2023-12-01 (×2): 600 mg via ORAL
  Filled 2023-12-01 (×2): qty 2

## 2023-12-01 MED ORDER — FUROSEMIDE 40 MG PO TABS: 40.0000 mg | ORAL_TABLET | Freq: Every day | ORAL | Status: DC

## 2023-12-01 MED ORDER — INFLUENZA VIRUS VACC SPLIT PF (FLUZONE) 0.5 ML IM SUSY: 0.5000 mL | PREFILLED_SYRINGE | INTRAMUSCULAR | Status: DC

## 2023-12-01 MED ORDER — INDOMETHACIN 50 MG PO CAPS: 50.0000 mg | ORAL_CAPSULE | Freq: Two times a day (BID) | ORAL | Status: DC

## 2023-12-01 MED ORDER — DOXYCYCLINE HYCLATE 100 MG PO TABS: 100.0000 mg | ORAL_TABLET | Freq: Two times a day (BID) | ORAL | 0 refills | Status: AC

## 2023-12-01 MED ORDER — NYSTATIN 100000 UNIT/GM EX POWD: Freq: Three times a day (TID) | CUTANEOUS | 0 refills | Status: DC

## 2023-12-01 MED ORDER — FUROSEMIDE 20 MG PO TABS: 20.0000 mg | ORAL_TABLET | Freq: Every day | ORAL | Status: DC

## 2023-12-01 MED ORDER — LOSARTAN POTASSIUM-HCTZ 100-25 MG PO TABS: 1.0000 | ORAL_TABLET | Freq: Every day | ORAL | Status: DC

## 2023-12-01 NOTE — Progress Notes (Signed)
 Pt discharged via WC to POV with all personal belongings in her possession. Discharge instructions reviewed and pt states understanding.

## 2023-12-01 NOTE — Plan of Care (Signed)
°  Problem: Respiratory: °Goal: Ability to maintain a clear airway will improve °Outcome: Progressing °  °

## 2023-12-01 NOTE — TOC CM/SW Note (Signed)
 Transition of Care [facility]) - Inpatient Brief Assessment   Patient Details  Name: Jennifer Odonnell MRN: [id number] Date of Birth: 27-Sep-1965  Transition of Care [facility]) CM/SW Contact:    Villa Herb, LCSWA Phone Number: 12/01/2023, 10:02 AM   Clinical Narrative: Transition of Care Department [facility]) has reviewed patient and no TOC needs have been identified at this time. We will continue to monitor patient advancement through interdiciplinary progression rounds. If new patient transition needs arise, please place a TOC consult.   Transition of Care Asessment: Insurance and Status: Insurance coverage has been reviewed Patient has primary care physician: Yes Home environment has been reviewed: From home Prior level of function:: Independent Prior/Current Home Services: No current home services Social Drivers of Health Review: SDOH reviewed no interventions necessary Readmission risk has been reviewed: Yes Transition of care needs: no transition of care needs at this time

## 2023-12-01 NOTE — Discharge Summary (Signed)
 Physician Discharge Summary   Patient: Jennifer Odonnell MRN: [id number] DOB: November 27, 1965  Admit date:     11/30/2023  Discharge date: 12/01/23  Discharge Physician: Vassie Loll   PCP: Aggie Cosier, MD   Recommendations at discharge:  Repeat basic metabolic panel to follow ultralights renal function Repeat CBC to follow hemoglobin trend/stability Continue assisting patient with weight management Reassess blood pressure and adjust antihypertensive as required Continue to follow CBGs/A1c with further adjustment to hypoglycemic regimen as needed. Continue to adjust analgesic therapy as needed.  Discharge Diagnoses: Principal Problem:   AKI (acute kidney injury) (HCC) Active Problems:   COPD with acute exacerbation (HCC)   CREST syndrome (HCC)   DM2 (diabetes mellitus, type 2) (HCC)   HTN (hypertension)   Chronic hypoxic respiratory failure (HCC)   Hypoglycemia  Brief Hospital admit narrative: As per H&P written by Dr. Mariea Clonts on 11/30/2023  Jennifer Odonnell is a 58 y.o. 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 Patient presented to the ED with 1 day of difficulty breathing, worsening cough productive of brownish sputum, and wheezing.  She has chronic bilateral lower extremity edema that intermittently worsens, but is at baseline today.  She also complains of bilateral knee pains the past several days, and right hip pain over the past 2 weeks after a fall.  She ambulates with a wheelchair outside her house, but she is reports she is unable to use the wheelchair inside her house, but she uses walls and surfaces for support to ambulate inside her house.  She has been compliant with her medications which include Lasix 40 mg daily.  No vomiting no diarrhea.   ED Course: Temperature 98.8.  Heart rate 79-80.  Respiratory 20-22.  Blood pressure systolic 115-135.  O2 sats 92 - 95  percent on 6 L.   Creatinine elevated 1.65.  Blood sugars 57. Chest x-ray shows atelectasis. Pelvic x-ray negative for acute abnormality. Head CT shows old lacunar infarct. CT chest abdomen and pelvis without contrast negative for acute abnormality. Cervical lumbar and thoracic CT spine without acute abnormality. Albuterol neb given, 125 mg Solu-Medrol given, LR at 100 cc/h started.  Assessment and Plan: * AKI (acute kidney injury) (HCC) -Creatinine elevated 1.65, baseline 0.8-0.9.  No GI losses.  Likely from nephrotoxic agents usage: Including NSAIDs, Lasix, losartan and HCTZ. -Significant rapid improvement in patient's renal function appreciated after fluid resuscitation and holding nephrotoxic agents -Patient has been instructed to continue holding the use of NSAIDs, Lasix, losartan and HCTZ at discharge -To maintain adequate hydration and to follow-up with PCP in 1 week.  COPD with acute exacerbation (HCC) Presenting with dyspnea, productive cough, wheezing.  On my evaluation patient is status post albuterol nebs with improvement in wheezing.  Chest x-ray minimal bibasilar subsegmental atelectasis.  No increased O2 demands, with sats greater than 92% on home 6 L. -No wheezing on today's examination and demonstrating improved air movement bilaterally -Patient will continue home bronchodilator management and chronic daily steroids treatment. -Continue outpatient follow-up with pulmonology service to further adjust maintenance management as required.  Chronic hypoxic respiratory failure (HCC) -Good saturation on 6 L supplementation appreciated on exam.  HTN (hypertension) -Low-sodium/heart healthy diet has been recommended -Continue treatment with atenolol.  DM2 (diabetes mellitus, type 2) (HCC) -Resume home hypoglycemic regimen -Patient advised to maintain adequate hydration and to follow modified carbohydrate diet.  CREST syndrome (HCC) - Resume Plaquenil -Resume  carisoprodol -Resume home steroids  at discharge -Continue outpatient follow-up with rheumatology service.  Morbid obesity -Body mass index is 56.49 kg/m. -Low-calorie diet and portion control discussed with patient.  Consultants: None Procedures performed: See below for x-ray reports. Disposition: Home Diet recommendation: Modified carbohydrate/low calorie and heart healthy diet.  DISCHARGE MEDICATION: Allergies as of 12/01/2023       Reactions   Daucus Carota Swelling   Raw carrots   Lisinopril Anaphylaxis   Tiotropium Swelling   Erythromycin Diarrhea   Ambien [zolpidem] Other (See Comments)   Woke up disoriented and scared.   Spiriva Handihaler [tiotropium Bromide Monohydrate]         Medication List     STOP taking these medications    ibuprofen 800 MG tablet Commonly known as: ADVIL   mupirocin ointment 2 % Commonly known as: BACTROBAN   promethazine 25 MG tablet Commonly known as: PHENERGAN       TAKE these medications    Advair Diskus 250-50 MCG/ACT Aepb Generic drug: fluticasone-salmeterol 1 puff 2 (two) times daily.   albuterol 108 (90 Base) MCG/ACT inhaler Commonly known as: VENTOLIN HFA Inhale 2 puffs into the lungs every 6 (six) hours as needed for wheezing or shortness of breath.   albuterol (2.5 MG/3ML) 0.083% nebulizer solution Commonly known as: PROVENTIL Take 3 mLs (2.5 mg total) by nebulization every 4 (four) hours as needed for wheezing or shortness of breath.   ascorbic acid 500 MG tablet Commonly known as: VITAMIN C Take 500 mg by mouth daily.   aspirin EC 81 MG tablet Take 81 mg by mouth daily.   atenolol 25 MG tablet Commonly known as: TENORMIN Take 25 mg by mouth daily.   buPROPion 300 MG 24 hr tablet Commonly known as: WELLBUTRIN XL Take 300 mg by mouth daily.   carisoprodol 350 MG tablet Commonly known as: SOMA Take 350 mg by mouth 3 (three) times daily.   colchicine 0.6 MG tablet Take 0.6 mg by mouth daily.    cyclobenzaprine 10 MG tablet Commonly known as: FLEXERIL Take 10 mg by mouth 3 (three) times daily.   docusate sodium 100 MG capsule Commonly known as: COLACE Take 100 mg by mouth 2 (two) times daily.   doxycycline 100 MG tablet Commonly known as: VIBRA-TABS Take 1 tablet (100 mg total) by mouth every 12 (twelve) hours for 4 days.   fenofibrate 48 MG tablet Commonly known as: TRICOR Take 48 mg by mouth daily.   FeroSul 325 (65 Fe) MG tablet Generic drug: ferrous sulfate Take 325 mg by mouth daily.   folic acid 1 MG tablet Commonly known as: FOLVITE Take 1 mg by mouth daily.   furosemide 20 MG tablet Commonly known as: LASIX Take 1 tablet (20 mg total) by mouth daily. Home medication and to follow-up with PCP What changed: additional instructions   furosemide 40 MG tablet Commonly known as: LASIX Take 1 tablet (40 mg total) by mouth daily. Hold medication until follow up with PCP What changed: additional instructions   gabapentin 600 MG tablet Commonly known as: NEURONTIN Take 600 mg by mouth 3 (three) times daily.   glipiZIDE 2.5 MG 24 hr tablet Commonly known as: GLUCOTROL XL Take 2.5 mg by mouth every morning.   HumuLIN R 100 UNIT/ML injection Generic drug: insulin regular Inject 30 Units into the skin 3 (three) times daily before meals.   hydroxychloroquine 200 MG tablet Commonly known as: PLAQUENIL Take 400 mg by mouth daily.   indomethacin 50 MG capsule Commonly known as:  INDOCIN Take 1 capsule (50 mg total) by mouth 2 times daily at 12 noon and 4 pm. What changed: when to take this   ipratropium-albuterol 0.5-2.5 (3) MG/3ML Soln Commonly known as: DUONEB Take 3 mLs by nebulization 3 (three) times daily.   Lantus SoloStar 100 UNIT/ML Solostar Pen Generic drug: insulin glargine Inject 71 Units into the skin 2 (two) times daily.   losartan-hydrochlorothiazide 100-25 MG tablet Commonly known as: HYZAAR Take 1 tablet by mouth daily. Hold medication  until follow up with PCP What changed: additional instructions   montelukast 10 MG tablet Commonly known as: SINGULAIR Take 10 mg by mouth daily.   nystatin powder Commonly known as: MYCOSTATIN/NYSTOP Apply topically 3 (three) times daily. What changed:  when to take this Another medication with the same name was removed. Continue taking this medication, and follow the directions you see here.   omeprazole 40 MG capsule Commonly known as: PRILOSEC Take 40 mg by mouth daily.   ondansetron 4 MG disintegrating tablet Commonly known as: ZOFRAN-ODT Take 4 mg by mouth 3 (three) times daily as needed.   One-A-Day Womens Formula Tabs Take 1 tablet by mouth daily.   Oxycodone HCl 10 MG Tabs Take 10 mg by mouth 4 (four) times daily.   predniSONE 10 MG tablet Commonly known as: DELTASONE Take 10 mg by mouth daily with breakfast.   Premarin 0.625 MG tablet Generic drug: estrogens (conjugated) Take 0.625 mg by mouth daily.   promethazine-dextromethorphan 6.25-15 MG/5ML syrup Commonly known as: PROMETHAZINE-DM Take 5 mLs by mouth 3 (three) times daily as needed.   sertraline 100 MG tablet Commonly known as: ZOLOFT Take 100 mg by mouth daily.   simvastatin 40 MG tablet Commonly known as: ZOCOR Take 40 mg by mouth every evening.   sitaGLIPtin 100 MG tablet Commonly known as: JANUVIA Take 100 mg by mouth daily.   Vitamin D (Ergocalciferol) 1.25 MG (50000 UNIT) Caps capsule Commonly known as: DRISDOL Take 50,000 Units by mouth once a week.        Follow-up Information     Aggie Cosier, MD. Schedule an appointment as soon as possible for a visit in 1 week(s).   Specialties: Internal Medicine, Infectious Diseases Contact information: [facility] [address] [place] [address] [phone]                Discharge Exam: Ceasar Mons Weights   11/30/23 1213  Weight: (!) 158.8 kg   General exam: Alert, awake, oriented x 3; afebrile, no chest  pain, no nausea, no vomiting. Respiratory system: Improved air movement bilaterally; no using accessory muscles and no wheezing on today's exam.  Good saturation on chronic supplementation. Cardiovascular system:RRR. No rub or gallop; unable to assess JVD with body habitus. Gastrointestinal system: Abdomen is obese, nondistended, soft and nontender. No organomegaly or masses felt. Normal bowel sounds heard. Central nervous system: No focal neurological deficits. Extremities: No cyanosis or clubbing. Skin: No petechiae. Psychiatry: Judgement and insight appear normal. Mood & affect appropriate.    Condition at discharge: Stable and improved.  The results of significant diagnostics from this hospitalization (including imaging, microbiology, ancillary and laboratory) are listed below for reference.   Imaging Studies: CT Cervical Spine Wo Contrast Result Date: 11/30/2023 CLINICAL DATA:  Neck pain.  Fell 2 weeks ago. EXAM: CT HEAD WITHOUT CONTRAST CT CERVICAL SPINE WITHOUT CONTRAST TECHNIQUE: Multidetector CT imaging of the head and cervical spine was performed following the standard protocol without intravenous contrast. Multiplanar CT image reconstructions of  the cervical spine were also generated. RADIATION DOSE REDUCTION: This exam was performed according to the departmental dose-optimization program which includes automated exposure control, adjustment of the mA and/or kV according to patient size and/or use of iterative reconstruction technique. COMPARISON:  None Available. FINDINGS: CT HEAD FINDINGS Brain: No evidence of acute infarction, hemorrhage, hydrocephalus, extra-axial collection or mass lesion/mass effect. 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 Vascular: Atherosclerotic vascular calcification of the carotid siphons. No hyperdense vessel. Skull: Normal. Negative for fracture or focal lesion. Sinuses/Orbits: No acute finding. The paranasal sinuses are clear. Partial opacification both  mastoid air cells. The orbits are unremarkable. Other: None. CT CERVICAL SPINE FINDINGS Alignment: Reversal of the normal cervical lordosis. Mild anterolisthesis C3-C4. Mild retrolisthesis at C5-C6. Skull base and vertebrae: No acute fracture. No primary bone lesion or focal pathologic process. Soft tissues and spinal canal: No prevertebral fluid or swelling. No visible canal hematoma. Medial deviation of the cervical internal carotid arteries. Disc levels: Multilevel disc height loss and uncovertebral hypertrophy, worst at C5-C6 and C6-C7. Diffuse facet arthropathy. Upper chest: Negative. Other: None. IMPRESSION: 1. No acute intracranial abnormality. 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 2. No acute cervical spine fracture or traumatic listhesis. Electronically Signed   By: Obie Dredge M.D.   On: 11/30/2023 13:57   CT Head Wo Contrast Result Date: 11/30/2023 CLINICAL DATA:  Neck pain.  Fell 2 weeks ago. EXAM: CT HEAD WITHOUT CONTRAST CT CERVICAL SPINE WITHOUT CONTRAST TECHNIQUE: Multidetector CT imaging of the head and cervical spine was performed following the standard protocol without intravenous contrast. Multiplanar CT image reconstructions of the cervical spine were also generated. RADIATION DOSE REDUCTION: This exam was performed according to the departmental dose-optimization program which includes automated exposure control, adjustment of the mA and/or kV according to patient size and/or use of iterative reconstruction technique. COMPARISON:  None Available. FINDINGS: CT HEAD FINDINGS Brain: No evidence of acute infarction, hemorrhage, hydrocephalus, extra-axial collection or mass lesion/mass effect. 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 Vascular: Atherosclerotic vascular calcification of the carotid siphons. No hyperdense vessel. Skull: Normal. Negative for fracture or focal lesion. Sinuses/Orbits: No acute finding. The paranasal sinuses are clear. Partial opacification both mastoid air  cells. The orbits are unremarkable. Other: None. CT CERVICAL SPINE FINDINGS Alignment: Reversal of the normal cervical lordosis. Mild anterolisthesis C3-C4. Mild retrolisthesis at C5-C6. Skull base and vertebrae: No acute fracture. No primary bone lesion or focal pathologic process. Soft tissues and spinal canal: No prevertebral fluid or swelling. No visible canal hematoma. Medial deviation of the cervical internal carotid arteries. Disc levels: Multilevel disc height loss and uncovertebral hypertrophy, worst at C5-C6 and C6-C7. Diffuse facet arthropathy. Upper chest: Negative. Other: None. IMPRESSION: 1. No acute intracranial abnormality. 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 2. No acute cervical spine fracture or traumatic listhesis. Electronically Signed   By: Obie Dredge M.D.   On: 11/30/2023 13:57   CT T-SPINE NO CHARGE Result Date: 11/30/2023 CLINICAL DATA:  Radiculopathy.  Recent fall 2 weeks ago. EXAM: CT THORACIC AND LUMBAR SPINE WITHOUT CONTRAST TECHNIQUE: Multiplanar CT images of the thoracic and lumbar spine were reconstructed from contemporary CT of the Chest, Abdomen, and Pelvis. RADIATION DOSE REDUCTION: This exam was performed according to the departmental dose-optimization program which includes automated exposure control, adjustment of the mA and/or kV according to patient size and/or use of iterative reconstruction technique. CONTRAST:  None. COMPARISON:  Chest x-ray dated September 26, 2023. FINDINGS: CT THORACIC SPINE FINDINGS Alignment: Mild levoscoliosis  of the upper thoracic spine. No listhesis. Vertebrae: No acute fracture or focal pathologic process. Paraspinal and other soft tissues: Coronary, aortic arch, and branch vessel atherosclerotic vascular disease. Disc levels: Mild multilevel degenerative disc disease. Moderate right-sided facet arthropathy in the upper thoracic spine. CT LUMBAR SPINE FINDINGS Segmentation: 5 lumbar type vertebrae. Alignment: Normal. Vertebrae: No acute  fracture or focal pathologic process. Paraspinal and other soft tissues: Decreased liver density. Aortoiliac atherosclerotic vascular disease. Disc levels: Moderate degenerative disc disease and facet arthropathy at L4-L5 and L5-S1. IMPRESSION: 1. No acute osseous abnormality. 2. Mild multilevel thoracic spondylosis. 3. Moderate lower lumbar spondylosis. Electronically Signed   By: Obie Dredge M.D.   On: 11/30/2023 13:47   CT L-SPINE NO CHARGE Result Date: 11/30/2023 CLINICAL DATA:  Radiculopathy.  Recent fall 2 weeks ago. EXAM: CT THORACIC AND LUMBAR SPINE WITHOUT CONTRAST TECHNIQUE: Multiplanar CT images of the thoracic and lumbar spine were reconstructed from contemporary CT of the Chest, Abdomen, and Pelvis. RADIATION DOSE REDUCTION: This exam was performed according to the departmental dose-optimization program which includes automated exposure control, adjustment of the mA and/or kV according to patient size and/or use of iterative reconstruction technique. CONTRAST:  None. COMPARISON:  Chest x-ray dated September 26, 2023. FINDINGS: CT THORACIC SPINE FINDINGS Alignment: Mild levoscoliosis of the upper thoracic spine. No listhesis. Vertebrae: No acute fracture or focal pathologic process. Paraspinal and other soft tissues: Coronary, aortic arch, and branch vessel atherosclerotic vascular disease. Disc levels: Mild multilevel degenerative disc disease. Moderate right-sided facet arthropathy in the upper thoracic spine. CT LUMBAR SPINE FINDINGS Segmentation: 5 lumbar type vertebrae. Alignment: Normal. Vertebrae: No acute fracture or focal pathologic process. Paraspinal and other soft tissues: Decreased liver density. Aortoiliac atherosclerotic vascular disease. Disc levels: Moderate degenerative disc disease and facet arthropathy at L4-L5 and L5-S1. IMPRESSION: 1. No acute osseous abnormality. 2. Mild multilevel thoracic spondylosis. 3. Moderate lower lumbar spondylosis. Electronically Signed   By: Obie Dredge M.D.   On: 11/30/2023 13:47   CT CHEST ABDOMEN PELVIS WO CONTRAST Result Date: 11/30/2023 CLINICAL DATA:  Shortness of breath. Right hip pain after fall 2 weeks ago. EXAM: CT CHEST, ABDOMEN AND PELVIS WITHOUT CONTRAST TECHNIQUE: Multidetector CT imaging of the chest, abdomen and pelvis was performed following the standard protocol without IV contrast. RADIATION DOSE REDUCTION: This exam was performed according to the departmental dose-optimization program which includes automated exposure control, adjustment of the mA and/or kV according to patient size and/or use of iterative reconstruction technique. COMPARISON:  None Available. FINDINGS: CT CHEST FINDINGS Cardiovascular: Atherosclerosis of thoracic aorta without aneurysm formation. Mild cardiomegaly. No pericardial effusion. Minimal coronary artery calcifications are noted. Mediastinum/Nodes: No enlarged mediastinal, hilar, or axillary lymph nodes. Thyroid gland, trachea, and esophagus demonstrate no significant findings. Lungs/Pleura: No pneumothorax or pleural effusion is noted. Minimal bibasilar subsegmental atelectasis is noted. Musculoskeletal: No chest wall mass or suspicious bone lesions identified. CT ABDOMEN PELVIS FINDINGS Hepatobiliary: Hepatic steatosis. No cholelithiasis or biliary dilatation. Pancreas: Unremarkable. No pancreatic ductal dilatation or surrounding inflammatory changes. Spleen: Normal in size without focal abnormality. Adrenals/Urinary Tract: Adrenal glands are unremarkable. Kidneys are normal, without renal calculi, focal lesion, or hydronephrosis. Bladder is unremarkable. Stomach/Bowel: Stomach is unremarkable. There is no evidence of bowel obstruction or inflammation. The appendix is not clearly visualized. Vascular/Lymphatic: Aortic atherosclerosis. No enlarged abdominal or pelvic lymph nodes. Reproductive: Status post hysterectomy. No adnexal masses. Other: No ascites or hernia is noted. Musculoskeletal: No acute or  significant osseous findings. IMPRESSION: Minimal bibasilar subsegmental atelectasis.  Hepatic steatosis. No other significant abnormality seen in the chest, abdomen or pelvis. Aortic Atherosclerosis (ICD10-I70.0). Electronically Signed   By: Lupita Raider M.D.   On: 11/30/2023 13:40   DG Hip Unilat W or Wo Pelvis 2-3 Views Right Result Date: 11/30/2023 CLINICAL DATA:  Right hip pain after fall 2 weeks ago. EXAM: DG HIP (WITH OR WITHOUT PELVIS) 2-3V RIGHT COMPARISON:  None Available. FINDINGS: There is no evidence of hip fracture or dislocation. Mild osteophyte formation of right hip is noted. IMPRESSION: Mild degenerative joint disease of right hip. No acute abnormality seen. Electronically Signed   By: Lupita Raider M.D.   On: 11/30/2023 13:24   DG Chest Port 1 View Result Date: 11/30/2023 CLINICAL DATA:  Shortness of breath and wheezing. EXAM: PORTABLE CHEST 1 VIEW COMPARISON:  September 26, 2023. FINDINGS: Stable cardiomegaly. Minimal bibasilar subsegmental atelectasis is noted. Bony thorax is unremarkable. IMPRESSION: Minimal bibasilar subsegmental atelectasis. Electronically Signed   By: Lupita Raider M.D.   On: 11/30/2023 13:22    Microbiology: Results for orders placed or performed during the hospital encounter of 11/30/23  Resp panel by RT-PCR (RSV, Flu A&B, Covid) Anterior Nasal Swab     Status: None   Collection Time: 11/30/23 12:44 PM   Specimen: Anterior Nasal Swab  Result Value Ref Range Status   SARS Coronavirus 2 by RT PCR NEGATIVE NEGATIVE Final    Comment: (NOTE) SARS-CoV-2 target nucleic acids are NOT DETECTED.  The SARS-CoV-2 RNA is generally detectable in upper respiratory specimens during the acute phase of infection. The lowest concentration of SARS-CoV-2 viral copies this assay can detect is 138 copies/mL. A negative result does not preclude SARS-Cov-2 infection and should not be used as the sole basis for treatment or other patient management decisions. A negative  result may occur with  improper specimen collection/handling, submission of specimen other than nasopharyngeal swab, presence of viral mutation(s) within the areas targeted by this assay, and inadequate number of viral copies(<138 copies/mL). A negative result must be combined with clinical observations, patient history, and epidemiological information. The expected result is Negative.  Fact Sheet for Patients:  BloggerCourse.com  Fact Sheet for Healthcare Providers:  SeriousBroker.it  This test is no t yet approved or cleared by the [place] FDA and  has been authorized for detection and/or diagnosis of SARS-CoV-2 by FDA under an Emergency Use Authorization (EUA). This EUA will remain  in effect (meaning this test can be used) for the duration of the COVID-19 declaration under Section 564(b)(1) of the Act, 21 U.S.C.section 360bbb-3(b)(1), unless the authorization is terminated  or revoked sooner.       Influenza A by PCR NEGATIVE NEGATIVE Final   Influenza B by PCR NEGATIVE NEGATIVE Final    Comment: (NOTE) The Xpert Xpress SARS-CoV-2/FLU/RSV plus assay is intended as an aid in the diagnosis of influenza from Nasopharyngeal swab specimens and should not be used as a sole basis for treatment. Nasal washings and aspirates are unacceptable for Xpert Xpress SARS-CoV-2/FLU/RSV testing.  Fact Sheet for Patients: BloggerCourse.com  Fact Sheet for Healthcare Providers: SeriousBroker.it  This test is not yet approved or cleared by the [place] FDA and has been authorized for detection and/or diagnosis of SARS-CoV-2 by FDA under an Emergency Use Authorization (EUA). This EUA will remain in effect (meaning this test can be used) for the duration of the COVID-19 declaration under Section 564(b)(1) of the Act, 21 U.S.C. section 360bbb-3(b)(1), unless the authorization is  terminated or revoked.  Resp Syncytial Virus by PCR NEGATIVE NEGATIVE Final    Comment: (NOTE) Fact Sheet for Patients: BloggerCourse.com  Fact Sheet for Healthcare Providers: SeriousBroker.it  This test is not yet approved or cleared by the [place] FDA and has been authorized for detection and/or diagnosis of SARS-CoV-2 by FDA under an Emergency Use Authorization (EUA). This EUA will remain in effect (meaning this test can be used) for the duration of the COVID-19 declaration under Section 564(b)(1) of the Act, 21 U.S.C. section 360bbb-3(b)(1), unless the authorization is terminated or revoked.  Performed at [facility], [address], [place], [place] [address]     Labs: CBC: Recent Labs  Lab 11/30/23 1338 12/01/23 0412  WBC 9.6 10.3  NEUTROABS 7.1  --   HGB 9.7* 10.0*  HCT 32.0* 32.0*  MCV 104.9* 103.9*  PLT 194 187   Basic Metabolic Panel: Recent Labs  Lab 11/30/23 1338 12/01/23 0412  NA 139 134*  K 4.4 5.2*  CL 85* 85*  CO2 43* 33*  GLUCOSE 57* 375*  BUN 31* 36*  CREATININE 1.65* 1.47*  CALCIUM 9.8 9.2  MG 2.0  --    Liver Function Tests: Recent Labs  Lab 11/30/23 1338  AST 38  ALT 29  ALKPHOS 47  BILITOT 0.6  PROT 6.7  ALBUMIN 3.5   CBG: Recent Labs  Lab 11/30/23 1724 11/30/23 2121 12/01/23 0731 12/01/23 1108  GLUCAP 172* 417* 284* 252*    Discharge time spent: greater than 30 minutes.  Signed: Vassie Loll, MD [facility] 12/01/2023

## 2023-12-01 NOTE — Inpatient Diabetes Management (Signed)
 Inpatient Diabetes Program Recommendations  AACE/ADA: New Consensus Statement on Inpatient Glycemic Control (2015)  Target Ranges:  Prepandial:   less than 140 mg/dL      Peak postprandial:   less than 180 mg/dL (1-2 hours)      Critically ill patients:  140 - 180 mg/dL   Lab Results  Component Value Date   GLUCAP 284 (H) 12/01/2023   HGBA1C 8.5 (H) 06/04/2023    Review of Glycemic Control  Latest Reference Range & Units 11/30/23 17:24 11/30/23 21:21 12/01/23 07:31  Glucose-Capillary 70 - 99 mg/dL 161 (H) 096 (H) 045 (H)   Diabetes history: DM 2 Outpatient Diabetes medications:  Humulin R 30 units tid with meals Glipizide 2.5 mg q AM Lantus 68 units bid Januvia 100 mg q AM Current orders for Inpatient glycemic control:  Novolog 0-15 units tid with meals and HS Semglee 30 units bid  Inpatient Diabetes Program Recommendations:    Consider adding Novolog meal coverage 8 units tid with meals (hold if patient eats less than 50% or NPO).   Thanks,  Lorenza Cambridge, RN, BC-ADM Inpatient Diabetes Coordinator Pager [phone]  (8a-5p)

## 2023-12-01 NOTE — Evaluation (Signed)
 Physical Therapy Evaluation Patient Details Name: Jennifer Odonnell MRN: [id number] DOB: 12-17-1965 Today's Date: 12/01/2023  History of Present Illness  Jennifer Odonnell is a 58 y.o. 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  Patient presented to the ED with 1 day of difficulty breathing, worsening cough productive of brownish sputum, and wheezing.  She has chronic bilateral lower extremity edema that intermittently worsens, but is at baseline today.  She also complains of bilateral knee pains the past several days, and right hip pain over the past 2 weeks after a fall.  She ambulates with a wheelchair outside her house, but she is reports she is unable to use the wheelchair inside her house, but she uses walls and surfaces for support to ambulate inside her house.   She has been compliant with her medications which include Lasix 40 mg daily.  No vomiting no diarrhea.    Clinical Impression  Patient long sitting in bed on therapist arrival.  After getting history and living situation information from patient she flatly refuses any mobility; reports high pain levels and that any movement with exacerbate her pain.  PT explains the importance of movement and offers to assist her to the bathroom.  She again refuses and also states she will refuse any option other than going home and that she will also not accept any further PT services either here at the hospital or at home or rehab.  No further PT services needed at this time due to patient refusal.          If plan is discharge home, recommend the following: A lot of help with walking and/or transfers;A lot of help with bathing/dressing/bathroom;Help with stairs or ramp for entrance;Assistance with cooking/housework   Can travel by private vehicle        Equipment Recommendations None recommended by PT  Recommendations for Other  Services       Functional Status Assessment Patient has had a recent decline in their functional status and demonstrates the ability to make significant improvements in function in a reasonable and predictable amount of time.     Precautions / Restrictions Precautions Precautions: Fall Recall of Precautions/Restrictions: Intact Restrictions Weight Bearing Restrictions Per Provider Order: No      Mobility  Bed Mobility               General bed mobility comments: patient flatly refuses all mobility.  States she is too painful to attempt.  Has a purewick and does not want to attempt to walk to the bathroom.    Transfers                        Ambulation/Gait                  Stairs            Wheelchair Mobility     Tilt Bed    Modified Rankin (Stroke Patients Only)       Balance                                             Pertinent Vitals/Pain Pain Assessment Pain Assessment: 0-10 Pain Score: 8  Pain Location: all over; declines any mobility due to pain Pain Intervention(s): Monitored during session  Home Living Family/patient expects to be discharged to:: Private residence Living Arrangements: Children Available Help at Discharge: Family Type of Home: House Home Access: Stairs to enter Entrance Stairs-Rails: Right Entrance Stairs-Number of Steps: 2 to enter home and an additonal 4 to get to her car   Home Layout: One level Home Equipment: Agricultural consultant (2 wheels);Cane - single point;Wheelchair - manual Additional Comments: oxygen 6 L    Prior Function Prior Level of Function : Needs assist       Physical Assist : ADLs (physical);Mobility (physical) Mobility (physical): Bed mobility;Transfers;Gait;Stairs ADLs (physical): Bathing;Dressing;IADLs Mobility Comments: Per pt, she does not use AD to ambulate in the home due the the home being too small. Describes leaning on furniture. ADLs Comments: Assist  for bathing and dressing from daughter.     Extremity/Trunk Assessment   Upper Extremity Assessment Upper Extremity Assessment: Right hand dominant    Lower Extremity Assessment Lower Extremity Assessment: Generalized weakness       Communication   Communication Communication: No apparent difficulties    Cognition Arousal: Alert Behavior During Therapy: WFL for tasks assessed/performed   PT - Cognitive impairments: No apparent impairments                         Following commands: Intact       Cueing       General Comments General comments (skin integrity, edema, etc.): did not assess balance as patient refuses mobility    Exercises     Assessment/Plan    PT Assessment Patient does not need any further PT services  PT Problem List         PT Treatment Interventions      PT Goals (Current goals can be found in the Care Plan section)  Acute Rehab PT Goals Patient Stated Goal: return home; states she will refuse any other option; will refuse home health as well PT Goal Formulation: With patient Time For Goal Achievement: 12/15/23 Potential to Achieve Goals: Fair    Frequency       Co-evaluation               AM-PAC PT "6 Clicks" Mobility  Outcome Measure Help needed turning from your back to your side while in a flat bed without using bedrails?: Total Help needed moving from lying on your back to sitting on the side of a flat bed without using bedrails?: Total Help needed moving to and from a bed to a chair (including a wheelchair)?: Total Help needed standing up from a chair using your arms (e.g., wheelchair or bedside chair)?: Total Help needed to walk in hospital room?: Total Help needed climbing 3-5 steps with a railing? : Total 6 Click Score: 6    End of Session Equipment Utilized During Treatment: Oxygen Activity Tolerance: Patient limited by pain Patient left: in bed;with call bell/phone within reach Nurse Communication:  Mobility status PT Visit Diagnosis: Pain;Muscle weakness (generalized) (M62.81);Other abnormalities of gait and mobility (R26.89)    Time: 0951-1010 PT Time Calculation (min) (ACUTE ONLY): 19 min   Charges:   PT Evaluation $PT Eval Moderate Complexity: 1 Mod   PT General Charges $$ ACUTE PT VISIT: 1 Visit        1:07 PM, 12/01/23 Cimone Fahey Small Drayce Tawil MPT [facility] [place] [phone] Ph:[phone]

## 2024-03-02 DEATH — deceased
